# Patient Record
Sex: Female | Born: 1965
Health system: Southern US, Community
[De-identification: ages and names within clinical notes are randomized; demographics above are authoritative.]

## PROBLEM LIST (undated history)

## (undated) DIAGNOSIS — K449 Diaphragmatic hernia without obstruction or gangrene: Secondary | ICD-10-CM

## (undated) DIAGNOSIS — F1290 Cannabis use, unspecified, uncomplicated: Secondary | ICD-10-CM

## (undated) HISTORY — DX: Diaphragmatic hernia without obstruction or gangrene: K44.9

## (undated) HISTORY — PX: UPPER GI ENDOSCOPY: SHX6162

## (undated) HISTORY — PX: TUBAL LIGATION: SHX77

## (undated) HISTORY — PX: TONSILLECTOMY: SUR1361

## (undated) HISTORY — PX: GANGLION CYST EXCISION: SHX1691

## (undated) HISTORY — PX: ABDOMINAL HYSTERECTOMY: SHX81

## (undated) HISTORY — DX: Cannabis use, unspecified, uncomplicated: F12.90

## (undated) HISTORY — PX: ANKLE SURGERY: SHX546

---

## 1999-05-30 ENCOUNTER — Other Ambulatory Visit: Admission: RE | Admit: 1999-05-30 | Discharge: 1999-05-30 | Payer: Self-pay | Admitting: Obstetrics and Gynecology

## 2005-03-08 ENCOUNTER — Observation Stay: Payer: Self-pay | Admitting: Obstetrics & Gynecology

## 2005-04-07 ENCOUNTER — Inpatient Hospital Stay: Payer: Self-pay | Admitting: Obstetrics & Gynecology

## 2005-04-19 ENCOUNTER — Emergency Department: Payer: Self-pay | Admitting: Emergency Medicine

## 2007-02-26 ENCOUNTER — Ambulatory Visit: Payer: Self-pay | Admitting: Obstetrics & Gynecology

## 2007-02-28 ENCOUNTER — Ambulatory Visit: Payer: Self-pay | Admitting: Internal Medicine

## 2007-03-04 ENCOUNTER — Other Ambulatory Visit: Payer: Self-pay

## 2007-03-04 ENCOUNTER — Ambulatory Visit: Payer: Self-pay | Admitting: Internal Medicine

## 2007-12-15 ENCOUNTER — Ambulatory Visit: Payer: Self-pay | Admitting: Internal Medicine

## 2008-03-16 ENCOUNTER — Ambulatory Visit: Payer: Self-pay | Admitting: Obstetrics & Gynecology

## 2008-04-19 ENCOUNTER — Ambulatory Visit: Payer: Self-pay | Admitting: Internal Medicine

## 2008-05-06 ENCOUNTER — Encounter: Payer: Self-pay | Admitting: Internal Medicine

## 2008-06-01 ENCOUNTER — Encounter: Payer: Self-pay | Admitting: Internal Medicine

## 2008-07-02 HISTORY — PX: LAPAROSCOPIC SUPRACERVICAL HYSTERECTOMY: SUR797

## 2009-04-05 ENCOUNTER — Ambulatory Visit: Payer: Self-pay | Admitting: Obstetrics & Gynecology

## 2009-04-12 ENCOUNTER — Ambulatory Visit: Payer: Self-pay | Admitting: Obstetrics & Gynecology

## 2009-04-27 ENCOUNTER — Ambulatory Visit: Payer: Self-pay | Admitting: Obstetrics & Gynecology

## 2009-05-05 ENCOUNTER — Ambulatory Visit: Payer: Self-pay | Admitting: Obstetrics & Gynecology

## 2010-04-25 LAB — LIPID PANEL
Cholesterol: 181 mg/dL (ref 0–200)
HDL: 60 mg/dL (ref 35–70)
LDl/HDL Ratio: 1.8
Triglycerides: 59 mg/dL (ref 40–160)

## 2010-04-25 LAB — BASIC METABOLIC PANEL
Glucose: 87 mg/dL
Potassium: 4.6 mmol/L (ref 3.4–5.3)
Sodium: 137 mmol/L (ref 137–147)

## 2010-04-25 LAB — TSH: TSH: 2.26 u[IU]/mL (ref 0.41–5.90)

## 2010-04-25 LAB — CBC AND DIFFERENTIAL
HCT: 39 % (ref 36–46)
Hemoglobin: 13.6 g/dL (ref 12.0–16.0)
Neutrophils Absolute: 59 /uL

## 2010-05-09 ENCOUNTER — Ambulatory Visit: Payer: Self-pay | Admitting: Obstetrics & Gynecology

## 2010-05-31 ENCOUNTER — Ambulatory Visit: Payer: Self-pay | Admitting: Obstetrics & Gynecology

## 2011-01-04 ENCOUNTER — Ambulatory Visit: Payer: Self-pay | Admitting: Obstetrics & Gynecology

## 2011-02-21 ENCOUNTER — Ambulatory Visit (INDEPENDENT_AMBULATORY_CARE_PROVIDER_SITE_OTHER): Payer: Self-pay | Admitting: Internal Medicine

## 2011-02-21 ENCOUNTER — Encounter: Payer: Self-pay | Admitting: Internal Medicine

## 2011-02-21 DIAGNOSIS — K2970 Gastritis, unspecified, without bleeding: Secondary | ICD-10-CM

## 2011-02-21 DIAGNOSIS — K2990 Gastroduodenitis, unspecified, without bleeding: Secondary | ICD-10-CM

## 2011-02-21 MED ORDER — TRAMADOL HCL 50 MG PO TABS: 50.0000 mg | ORAL_TABLET | Freq: Four times a day (QID) | ORAL | Status: AC | PRN

## 2011-02-21 MED ORDER — ESOMEPRAZOLE MAGNESIUM 40 MG PO CPDR
40.0000 mg | DELAYED_RELEASE_CAPSULE | Freq: Every day | ORAL | Status: DC
Start: 2011-02-21 — End: 2011-04-10

## 2011-02-21 NOTE — Progress Notes (Signed)
  Subjective:    Patient ID: Kelly Yang, female    DOB: 08/26/1965, 45 y.o.   MRN: [id number]  HPI Patient presents with 1 months history of worsening abdominal pain which is epsiodic in nature and at times limiting her po intake. Aggravated by bowel movements and hunger. Improves when she smokes marijuana Mid epigastric, not crampy in quality, more burning or aching in nature.  Used to take nexium for GERD years ago but no PPI in the last year.  Has been using ibuprofen daily,  Alcohol on the weekends in moderation.  No weight loss, vomiting or nausea.  No diarrhea or blood in stools. Pain    Review of Systems  Constitutional: Negative for fever, chills and unexpected weight change.  HENT: Negative for hearing loss, ear pain, nosebleeds, congestion, sore throat, facial swelling, rhinorrhea, sneezing, mouth sores, trouble swallowing, neck pain, neck stiffness, voice change, postnasal drip, sinus pressure, tinnitus and ear discharge.   Eyes: Negative for pain, discharge, redness and visual disturbance.  Respiratory: Negative for cough, chest tightness, shortness of breath, wheezing and stridor.   Cardiovascular: Negative for chest pain, palpitations and leg swelling.  Gastrointestinal: Positive for abdominal pain. Negative for nausea, diarrhea, constipation and blood in stool.  Musculoskeletal: Positive for arthralgias. Negative for myalgias.  Skin: Negative for color change and rash.  Neurological: Negative for dizziness, weakness, light-headedness and headaches.  Hematological: Negative for adenopathy.  Psychiatric/Behavioral: The patient is nervous/anxious.        Objective:   Physical Exam  Constitutional: Vital signs are normal. She appears well-developed and well-nourished.  Cardiovascular: Normal rate and regular rhythm.   Abdominal: Soft. Bowel sounds are normal. She exhibits no shifting dullness, no distension, no pulsatile liver, no abdominal bruit and no pulsatile midline mass.  There is tenderness.    Musculoskeletal: Normal range of motion.          Assessment & Plan:  1) Abdominal pain: given her history, I suspect NSAID induced gastritis.  Trial of Nexium and stop all NSAIDs,  Will check H Pylori Ag.  If symptoms do no improve in 2-3 weeks, refer to GI for evaluation.  2) Depression: symptoms aggravated by noncompliance with daily use of SSRI.  Discussed pharacokinetics of SSRIss; she will try to remember to take meds on weekends.

## 2011-02-22 LAB — COMPREHENSIVE METABOLIC PANEL
ALT: 13 U/L (ref 0–35)
BUN: 15 mg/dL (ref 6–23)
CO2: 30 mEq/L (ref 19–32)
Calcium: 9.5 mg/dL (ref 8.4–10.5)
Chloride: 102 mEq/L (ref 96–112)
Creatinine, Ser: 0.9 mg/dL (ref 0.4–1.2)
GFR: 69.61 mL/min (ref >60.00)
Glucose, Bld: 96 mg/dL (ref 70–99)
Total Bilirubin: 0.6 mg/dL (ref 0.3–1.2)

## 2011-02-22 LAB — LIPASE: Lipase: 52 U/L (ref 11.0–59.0)

## 2011-02-22 LAB — H. PYLORI ANTIBODY, IGG: H Pylori IgG: NEGATIVE

## 2011-02-23 ENCOUNTER — Encounter: Payer: Self-pay | Admitting: Internal Medicine

## 2011-02-28 ENCOUNTER — Telehealth: Payer: Self-pay | Admitting: Internal Medicine

## 2011-02-28 MED ORDER — PROMETHAZINE HCL 12.5 MG PO TABS: 25.0000 mg | ORAL_TABLET | Freq: Four times a day (QID) | ORAL | Status: AC | PRN

## 2011-02-28 MED ORDER — SUMATRIPTAN 20 MG/ACT NA SOLN: 1.0000 | NASAL | Status: DC | PRN

## 2011-02-28 NOTE — Telephone Encounter (Signed)
Patient called and stated she has had a migraine for the past three days with nausea and  vomiting.  She wanted to know if there is anything you could suggest for her to do or take.  Please advise

## 2011-02-28 NOTE — Telephone Encounter (Signed)
Can you call her in an rx for promethazine 25 mg one tablet every 8 hours as needed for nausea and vomiting ?  I will print the rx for this and for a migrain medication

## 2011-02-28 NOTE — Telephone Encounter (Signed)
Kelly Yang,  i sent rxs for promethazine and imitrex nasal spray to her pharmacy.

## 2011-03-12 ENCOUNTER — Telehealth: Payer: Self-pay | Admitting: Internal Medicine

## 2011-03-12 DIAGNOSIS — K2970 Gastritis, unspecified, without bleeding: Secondary | ICD-10-CM

## 2011-03-12 NOTE — Telephone Encounter (Signed)
Patient called and stated she saw you a couple weeks ago for stomach issues and you gave her Nexium and she has been taking it for three weeks.  She stated she is not felling better and you had said if the Nexium did not work you would refer her to GI.  Do you still want to refer patient?  Please advise.

## 2011-03-12 NOTE — Telephone Encounter (Signed)
Yes, does she want to someone in [place] or [place]?  If [place],  [facility].  IF [place].   GI.

## 2011-03-15 NOTE — Telephone Encounter (Signed)
Please make patient appt with [facility] for evaluation of gastritis not responding to daily Nexium.  Thank you

## 2011-03-15 NOTE — Telephone Encounter (Signed)
Patient says that she would like to go to [facility] in [place].

## 2011-04-10 ENCOUNTER — Encounter: Payer: Self-pay | Admitting: Gastroenterology

## 2011-04-10 ENCOUNTER — Ambulatory Visit (INDEPENDENT_AMBULATORY_CARE_PROVIDER_SITE_OTHER): Payer: BC Managed Care – PPO | Admitting: Gastroenterology

## 2011-04-10 ENCOUNTER — Other Ambulatory Visit: Payer: BC Managed Care – PPO

## 2011-04-10 DIAGNOSIS — R140 Abdominal distension (gaseous): Secondary | ICD-10-CM

## 2011-04-10 DIAGNOSIS — R143 Flatulence: Secondary | ICD-10-CM

## 2011-04-10 DIAGNOSIS — R141 Gas pain: Secondary | ICD-10-CM

## 2011-04-10 DIAGNOSIS — R109 Unspecified abdominal pain: Secondary | ICD-10-CM

## 2011-04-10 DIAGNOSIS — R6881 Early satiety: Secondary | ICD-10-CM | POA: Insufficient documentation

## 2011-04-10 MED ORDER — HYOSCYAMINE SULFATE 0.125 MG SL SUBL: 0.1250 mg | SUBLINGUAL_TABLET | SUBLINGUAL | Status: DC | PRN

## 2011-04-10 NOTE — Progress Notes (Signed)
History of Present Illness:  This is a very nice 45 year old mother of 3 children referred for evaluation of several years of recurrent epigastric and right upper quadrant pain with apparently a negative gallbladder evaluation 3 years ago. For the last 3 months she's had almost daily epigastric and right upper quadrant discomfort exacerbated by eating with associated early satiety, belching, burping, bloating and distention. She has some reflux symptoms, but a one-month followup Nexium caused no improvement. Patient denies nausea vomiting, anorexia or weight loss. There is no history of NSAID, cigarette, or alcohol abuse. The patient denies melena, hematochezia, or systemic complaints or specific hepatobiliary complaints. She her family is of [place] descent. She has not had previous endoscopic exams.  I have reviewed this patient's present history, medical and surgical past history, allergies and medications.     ROS: The remainder of the 10 point ROS is negative.. she's had a previous partial hysterectomy and denies any gynecologic issues. He does have occasional headaches and occasional shortness of breath with exercise. She has been on Celexa for depression all when necessary Imitrex, when necessary tramadol, and daily Valtrex.     Physical Exam: General well developed well nourished patient in no acute distress, appearing her stated age Eyes PERRLA, no icterus, fundoscopic exam per opthamologist Skin no lesions noted Neck supple, no adenopathy, no thyroid enlargement, no tenderness Chest clear to percussion and auscultation Heart no significant murmurs, gallops or rubs noted Abdomen no hepatosplenomegaly masses or tenderness, BS normal.  Rectal inspection normal no fissures, or fistulae noted.  No masses or tenderness on digital exam. Stool guaiac negative. Extremities no acute joint lesions, edema, phlebitis or evidence of cellulitis. Neurologic patient oriented x 3, cranial nerves intact,  no focal neurologic deficits noted. Psychological mental status normal and normal affect.  Assessment and plan: Possible gastroparesis, rule out celiac disease, gallbladder disease versus IBS. H. pylori serum antibodies were negative. There is no history of hepatitis or pancreatitis. Physical exam today is fairly unremarkable. I have scheduled upper abdominal ultrasound exam, endoscopic exam, and we'll check his serum celiac antibodies area in at prescribed when necessary sublingual Levsin pending further evaluation. She may need CCK-Hida scanning of her gallbladder.   Please copy her primary care physician, referring physician, and pertinent subspecialists.  Encounter Diagnoses  Name Primary?  . Abdominal pain Yes  . Abdominal bloating   . Early satiety

## 2011-04-10 NOTE — Patient Instructions (Addendum)
You have been scheduled for an Endoscopy, instructions have been provided. You have been scheduled for an Ultrasound at [facility] radiology please arrive at 745 am on 04/16/11 Nothing to eat or drink after midnight Please go to our basement lab today for lab work. Your prescription for Levsin has been sent to your pharmacy.

## 2011-04-11 LAB — CELIAC PANEL 10
Endomysial Screen: NEGATIVE
Gliadin IgG: 21.7 U/mL — ABNORMAL HIGH (ref <20)
Tissue Transglutaminase Ab, IgA: 5.6 U/mL (ref <20)

## 2011-04-13 ENCOUNTER — Encounter: Payer: Self-pay | Admitting: Gastroenterology

## 2011-04-13 ENCOUNTER — Telehealth: Payer: Self-pay | Admitting: *Deleted

## 2011-04-13 ENCOUNTER — Ambulatory Visit (AMBULATORY_SURGERY_CENTER): Payer: BC Managed Care – PPO | Admitting: Gastroenterology

## 2011-04-13 VITALS — BP 121/69 | HR 68 | Temp 98.0°F | Resp 20 | Ht 63.0 in | Wt 142.0 lb

## 2011-04-13 DIAGNOSIS — R109 Unspecified abdominal pain: Secondary | ICD-10-CM

## 2011-04-13 MED ORDER — LIDOCAINE VISCOUS 2 % MT SOLN: 20.0000 mL | OROMUCOSAL | Status: DC | PRN

## 2011-04-13 MED ORDER — SODIUM CHLORIDE 0.9 % IV SOLN: 500.0000 mL | INTRAVENOUS | Status: DC

## 2011-04-13 MED ORDER — LIDOCAINE VISCOUS 2 % MT SOLN: OROMUCOSAL | Status: DC

## 2011-04-13 NOTE — Patient Instructions (Addendum)
Incomplete exam-will add barium swallow / UGI  Series to ultrasound Monday . To arrive at 0745 for 8 am exam.  Please follow dilation diet as directed Will add viscous lidocaine to medication therapy- take 20 ml every 8 hours as needed for throat pain/ difficulty swallowing.

## 2011-04-13 NOTE — Telephone Encounter (Signed)
Ordered BS and UGI for Monday, 04/16/11 after pt's U/S. Also ordered Viscous Lidocaine for throat pain. Husband given instructions and appt times for procedures at [facility]; NPO after midnight and arrive at 07:45am; husband stated understanding.

## 2011-04-16 ENCOUNTER — Ambulatory Visit (HOSPITAL_COMMUNITY)
Admission: RE | Admit: 2011-04-16 | Discharge: 2011-04-16 | Disposition: A | Discharge status: Home / Self Care | Payer: BC Managed Care – PPO | Source: Ambulatory Visit | Attending: Gastroenterology | Admitting: Gastroenterology

## 2011-04-16 ENCOUNTER — Telehealth: Payer: Self-pay | Admitting: *Deleted

## 2011-04-16 DIAGNOSIS — R109 Unspecified abdominal pain: Secondary | ICD-10-CM | POA: Insufficient documentation

## 2011-04-16 NOTE — Telephone Encounter (Signed)

## 2011-04-16 NOTE — Progress Notes (Signed)
It was completed at the same time as the Upper GI series and results should come to you shortly.

## 2011-04-16 NOTE — Telephone Encounter (Signed)
Lm fpr pt to return call. Per Dr Jarold Motto, tests- U/S and barium swallow are normal; maybe her problem is coming from spasms. Per Dr Jarold Motto, schedule for f/u in  2 weeks: 05/03/11 at 0845am.

## 2011-04-17 NOTE — Telephone Encounter (Signed)
Informed pt of her f/u appt with Dr Jarold Motto to reassess her problems since U/S and BS were normal; pt will call for further problems.

## 2011-04-17 NOTE — Telephone Encounter (Signed)
Called pt at work and [facility] for her to call back.

## 2011-05-03 ENCOUNTER — Ambulatory Visit (INDEPENDENT_AMBULATORY_CARE_PROVIDER_SITE_OTHER): Payer: BC Managed Care – PPO | Admitting: Gastroenterology

## 2011-05-03 ENCOUNTER — Encounter: Payer: Self-pay | Admitting: Gastroenterology

## 2011-05-03 DIAGNOSIS — K3189 Other diseases of stomach and duodenum: Secondary | ICD-10-CM

## 2011-05-03 DIAGNOSIS — K30 Functional dyspepsia: Secondary | ICD-10-CM | POA: Insufficient documentation

## 2011-05-03 DIAGNOSIS — R109 Unspecified abdominal pain: Secondary | ICD-10-CM

## 2011-05-03 DIAGNOSIS — R1013 Epigastric pain: Secondary | ICD-10-CM

## 2011-05-03 NOTE — Patient Instructions (Addendum)
Dr Jarold Motto has recommended you to have a colonoscopy, If you decide to schedule you call call us back at [phone] We have also recommended you have a HIDA Scan with CCK to check your gallbladder. If you decide to schedule please contact our office

## 2011-05-03 NOTE — Progress Notes (Signed)
This is a 45 year old Caucasian female with persistent and recurrent epigastric and right upper quadrant  pain which occurs almost daily and is described as an aching discomfort made worse by bowel movements. The pain is not severe but may last 3-4 hours in duration. No other precipitating or alleviating elements except for some early satiety.. She tried one month of Nexium without improvement. She has refused to try anticholinergic medications. . Attempts at endoscopy were unsuccessful because of severe cricopharyngeal spasm, and post endoscopy upper GI series showed cricopharyngeal muscle thickening, but no other abnormalities. H. pylori antibodies are negative, and all lab tests are normal.  Upper bowel ultrasound also was performed and was unremarkable. She apparently had a previous negative gallbladder workup 3 years ago and was told that she had a" sluggish gallbladder". Apparently both her mother and her sister have had cholecystectomies. The patient denies other hepatobiliary complaints. She denies any specific food intolerances, and celiac bodies were negative. There is no history of anorexia, weight loss, or systemic complaints such as fever chills. She was previously on NSAIDs but has discontinued these medications.  Current Medications, Allergies, Past Medical History, Past Surgical History, Family History and Social History were reviewed in [employer] record.  Pertinent Review of Systems Negative   Physical Exam: Healthy-appearing white female in no acute distress appears stated age. Abdominal exam shows no organomegaly, masses, or localized tenderness. Bowel sounds are entirely normal.    Assessment and Plan: Probable functional dyspepsia, rule out biliary dyskinesia . I am concerned about her pain related to bowel movements, and I think colonoscopy is probably indicated as part of her workup since she has continued to have symptoms for many months unresponsive to  standard treatment. I have urged her to use when necessary sublingual Levsin. The patient refused both of these recommendations, and related that she will seek followup care with her primary care physician. Encounter Diagnosis  Name Primary?  . Abdominal pain Yes

## 2011-08-14 ENCOUNTER — Other Ambulatory Visit: Payer: Self-pay | Admitting: *Deleted

## 2011-08-14 MED ORDER — SUMATRIPTAN 20 MG/ACT NA SOLN: 1.0000 | NASAL | Status: DC | PRN

## 2011-08-14 MED ORDER — SUMATRIPTAN 5 MG/ACT NA SOLN: 1.0000 | NASAL | Status: DC | PRN

## 2011-08-14 NOTE — Telephone Encounter (Signed)
20 mg rx sent to [facility]

## 2011-08-14 NOTE — Telephone Encounter (Signed)
Pt states she was given sumatriptan nasal spray in the past for migraines and she would like refills called to [facility] [address]  She says this really helps her headaches.

## 2011-08-14 NOTE — Telephone Encounter (Signed)
She had 20 mg's before, per refill 02/28/11.

## 2011-08-14 NOTE — Telephone Encounter (Signed)
There are two strengths to the nasal spray,  5, and 20.  If she does not known which one she was taken , start with the 5. I will update chart with rx for 5 mg dose.

## 2011-10-04 ENCOUNTER — Other Ambulatory Visit: Payer: Self-pay | Admitting: Internal Medicine

## 2011-10-05 MED ORDER — CITALOPRAM HYDROBROMIDE 10 MG PO TABS: 10.0000 mg | ORAL_TABLET | Freq: Every day | ORAL | Status: DC

## 2011-10-19 ENCOUNTER — Other Ambulatory Visit: Payer: Self-pay | Admitting: Internal Medicine

## 2011-10-19 MED ORDER — CITALOPRAM HYDROBROMIDE 20 MG PO TABS: 20.0000 mg | ORAL_TABLET | Freq: Every day | ORAL | Status: DC

## 2012-05-04 ENCOUNTER — Other Ambulatory Visit: Payer: Self-pay | Admitting: Internal Medicine

## 2012-07-07 ENCOUNTER — Encounter: Payer: Self-pay | Admitting: Internal Medicine

## 2012-07-07 ENCOUNTER — Ambulatory Visit (INDEPENDENT_AMBULATORY_CARE_PROVIDER_SITE_OTHER): Payer: BC Managed Care – PPO | Admitting: Internal Medicine

## 2012-07-07 VITALS — BP 118/72 | HR 69 | Temp 98.0°F | Resp 16 | Wt 149.5 lb

## 2012-07-07 DIAGNOSIS — J40 Bronchitis, not specified as acute or chronic: Secondary | ICD-10-CM | POA: Insufficient documentation

## 2012-07-07 DIAGNOSIS — E785 Hyperlipidemia, unspecified: Secondary | ICD-10-CM

## 2012-07-07 DIAGNOSIS — F331 Major depressive disorder, recurrent, moderate: Secondary | ICD-10-CM

## 2012-07-07 DIAGNOSIS — R5383 Other fatigue: Secondary | ICD-10-CM

## 2012-07-07 DIAGNOSIS — R5381 Other malaise: Secondary | ICD-10-CM

## 2012-07-07 MED ORDER — AZITHROMYCIN 500 MG PO TABS: 500.0000 mg | ORAL_TABLET | Freq: Every day | ORAL | Status: DC

## 2012-07-07 MED ORDER — PREDNISONE (PAK) 10 MG PO TABS: ORAL_TABLET | ORAL | Status: DC

## 2012-07-07 MED ORDER — BUPROPION HCL 75 MG PO TABS: 75.0000 mg | ORAL_TABLET | Freq: Two times a day (BID) | ORAL | Status: DC

## 2012-07-07 NOTE — Assessment & Plan Note (Signed)
Symptoms are currently not well-controlled with citalopram and may have been triggered by the holidays. We discussed a trial of Wellbutrin as this seems to be more energizing. She is a remote trial of Wellbutrin with the only side effect she experienced the loss of libido. She is willing to try a medication change at this time due to her current symptoms.

## 2012-07-07 NOTE — Assessment & Plan Note (Signed)
HEENT exam is normal. Reassurance provided this is most likely viral bronchitis. Over-the-counter medications recommended and a prescription for azithromycin and given instructions to start only if he developed temperature greater than 100.4, purulent sputum, or increased work of breathing. Prednisone taper and cough suppressant given now to help with cycle.

## 2012-07-07 NOTE — Patient Instructions (Addendum)
I am prescribing you a prednisone taper, a 7 day course of vicodin for the cough  You should use either sudafed PE/afrin for nasal congestion or Afrin nasal spray twice daily for 5 days for the ear and sinus congestion   I also recommending using Simply Saline nasal spray twice daily to flush your sinuses out. (available OTC)  If your cough worsens add the azithromcyin   -----------------------------------------------------------------  Start the wellbutrin twice daily dose (2nd dose by 2 pm to prevent sleep issues)  Taper the citalopram to every other day after Day 3 for 7 days,  Then stop   We can increase your wellbutrin by e mail or follow up   Go for a walk!!

## 2012-07-07 NOTE — Progress Notes (Signed)
Patient ID: Kelly Yang, female   DOB: 12/05/1965, 47 y.o.   MRN: [id number]  Patient Active Problem List  Diagnosis  . Abdominal pain  . Abdominal bloating  . Early satiety  . Functional dyspepsia  . Depression, major, recurrent, moderate  . Bronchitis    Subjective:  CC:   Chief Complaint  Patient presents with  . Cough    x3weeks, productive  . Sore Throat    HPI:   Kelly Yang a 47 y.o. female who presents 1) 3 week history of persistent cough, which has been accompanied by sputum and nasal  drainage which was initially yellow.  2) depression,   managed historically with Citalopram 20 milligrams daily which has is no longer effective. Spent 2 weeks at home over the holidays,  spent half of the time sleeping, and at the end of her holiday she was too depressed to go to work. Did not want to be around people .  Feels like a failure as a mother,  Not angry.  Stuck in the same routine .   not suicidal. No history of physical or sexual abuse.    Past Medical History  Diagnosis Date  . Allergic rhinitis     rhinitis  . Hiatal hernia     EGD @ 18  . Genital herpes   . Marijuana smoker     Past Surgical History  Procedure Date  . Tubal ligation   . Abdominal hysterectomy     partical  . Tonsillectomy   . Ankle surgery     bilateral spurs  . Ganglion cyst excision     right wrist   The following portions of the patient's history were reviewed and updated as appropriate: Allergies, current medications, and problem list.    Review of Systems:   12 Pt  review of systems was negative except those addressed in the HPI,     History   Social History  . Marital Status: Married    Spouse Name: N/A    Number of Children: 3  . Years of Education: N/A   Occupational History  . chemist II    Social History Main Topics  . Smoking status: Never Smoker   . Smokeless tobacco: Never Used     Comment: Uses marijuana,daily use for over 15 years , still uses  regularly but not daily  . Alcohol Use: Yes     Comment: occasional  . Drug Use: Yes     Comment: marijuana  . Sexually Active: Not on file     Comment: remotely quit drug use(heavy marijuana for 15 years,quit when she was pregnant) and snorted cocaine < 10 times entire life in the past   Other Topics Concern  . Not on file   Social History Narrative   Lives with spouse and 2 childernHas dog    Objective:  BP 118/72  Pulse 69  Temp 98 F (36.7 C) (Oral)  Resp 16  Wt 149 lb 8 oz (67.813 kg)  SpO2 98%  General appearance: alert, cooperative and appears stated age Ears: normal TM's and external ear canals both ears Throat: lips, mucosa, and tongue normal; teeth and gums normal Neck: no adenopathy, no carotid bruit, supple, symmetrical, trachea midline and thyroid not enlarged, symmetric, no tenderness/mass/nodules Back: symmetric, no curvature. ROM normal. No CVA tenderness. Lungs: clear to auscultation bilaterally Heart: regular rate and rhythm, S1, S2 normal, no murmur, click, rub or gallop Abdomen: soft, non-tender; bowel sounds normal; no masses,  no organomegaly Pulses: 2+ and symmetric Skin: Skin color, texture, turgor normal. No rashes or lesions Lymph nodes: Cervical, supraclavicular, and axillary nodes normal.  Assessment and Plan:  Depression, major, recurrent, moderate Symptoms are currently not well-controlled with citalopram and may have been triggered by the holidays. We discussed a trial of Wellbutrin as this seems to be more energizing. She is a remote trial of Wellbutrin with the only side effect she experienced the loss of libido. She is willing to try a medication change at this time due to her current symptoms.  Bronchitis HEENT exam is normal. Reassurance provided this is most likely viral bronchitis. Over-the-counter medications recommended and a prescription for azithromycin and given instructions to start only if he developed temperature greater than  100.4, purulent sputum, or increased work of breathing. Prednisone taper and cough suppressant given now to help with cycle.   Updated Medication List Outpatient Encounter Prescriptions as of 07/07/2012  Medication Sig Dispense Refill  . citalopram (CELEXA) 20 MG tablet TAKE 1 TABLET DAILY  90 tablet  0  . SUMAtriptan (IMITREX) 20 MG/ACT nasal spray Place 1 spray (20 mg total) into the nose every 2 (two) hours as needed for migraine.  1 Inhaler  5  . valACYclovir (VALTREX) 500 MG tablet Take 500 mg by mouth as needed.        Marland Kitchen azithromycin (ZITHROMAX) 500 MG tablet Take 1 tablet (500 mg total) by mouth daily.  7 tablet  0  . buPROPion (WELLBUTRIN) 75 MG tablet Take 1 tablet (75 mg total) by mouth 2 (two) times daily.  60 tablet  1  . hyoscyamine (LEVSIN SL) 0.125 MG SL tablet Place 0.125 mg under the tongue every 6 (six) hours as needed.        . hyoscyamine (LEVSIN/SL) 0.125 MG SL tablet Place 1 tablet (0.125 mg total) under the tongue as needed for cramping.  30 tablet  0  . NEXIUM 40 MG capsule As needed      . predniSONE (STERAPRED UNI-PAK) 10 MG tablet 6 tablets on Day 1 , then reduce by 1 tablet daily until gone  21 tablet  0     Orders Placed This Encounter  Procedures  . Lipid panel  . Comprehensive metabolic panel  . TSH  . Iron and TIBC  . Follicle stimulating hormone  . B12  . CBC with Differential    No Follow-up on file.

## 2012-08-05 ENCOUNTER — Ambulatory Visit (INDEPENDENT_AMBULATORY_CARE_PROVIDER_SITE_OTHER): Payer: BC Managed Care – PPO | Admitting: Internal Medicine

## 2012-08-05 ENCOUNTER — Encounter: Payer: Self-pay | Admitting: Internal Medicine

## 2012-08-05 VITALS — BP 116/70 | HR 77 | Temp 98.1°F | Ht 63.5 in | Wt 150.2 lb

## 2012-08-05 DIAGNOSIS — F331 Major depressive disorder, recurrent, moderate: Secondary | ICD-10-CM

## 2012-08-05 MED ORDER — SERTRALINE HCL 50 MG PO TABS: 50.0000 mg | ORAL_TABLET | Freq: Every day | ORAL | Status: DC

## 2012-08-05 NOTE — Progress Notes (Signed)
Patient ID: Kelly Yang, female   DOB: May 16, 1966, 47 y.o.   MRN: [id number]  Patient Active Problem List  Diagnosis  . Abdominal pain  . Abdominal bloating  . Early satiety  . Functional dyspepsia  . Depression, major, recurrent, moderate  . Bronchitis    Subjective:  CC:   Chief Complaint  Patient presents with  . Follow-up    HPI:   Kelly R Claytonis a 47 y.o. female who presents for follow up on medication changes for increasing dysphoria and irritability of which start in the holidays. One month ago we switched her citalopram to Wellbutrin due to her symptoms of lack of concentration lack of energy. Since she has been taking the Wellbutrin she has noticed increased episodes of dizziness with sudden changes in head position which have been recurrent. She's also noting increased anger and irritability and has been outwardly aggressive and hostile.  He did not have these symptoms when she was taking citalopram however on citalopram she was more anxious and sad.     Prior trial of effexor caused chest pain .    Not functioning well at work,  No trouble sleeping     Past Medical History  Diagnosis Date  . Allergic rhinitis     rhinitis  . Hiatal hernia     EGD @ 18  . Genital herpes   . Marijuana smoker     Past Surgical History  Procedure Date  . Tubal ligation   . Abdominal hysterectomy     partical  . Tonsillectomy   . Ankle surgery     bilateral spurs  . Ganglion cyst excision     right wrist   The following portions of the patient's history were reviewed and updated as appropriate: Allergies, current medications, and problem list.    Review of Systems:  Patient denies headache, fevers, malaise, unintentional weight loss, skin rash, eye pain, sinus congestion and sinus pain, sore throat, dysphagia,  hemoptysis , cough, dyspnea, wheezing, chest pain, palpitations, orthopnea, edema, abdominal pain, nausea, melena, diarrhea, constipation, flank pain, dysuria,  hematuria, urinary  Frequency, nocturia, numbness, tingling, seizures,  Focal weakness, Loss of consciousness,  Tremor, insomnia, depression, anxiety, and suicidal ideation.     History   Social History  . Marital Status: Married    Spouse Name: N/A    Number of Children: 3  . Years of Education: N/A   Occupational History  . chemist II    Social History Main Topics  . Smoking status: Never Smoker   . Smokeless tobacco: Never Used     Comment: Uses marijuana,daily use for over 15 years , still uses regularly but not daily  . Alcohol Use: Yes     Comment: occasional  . Drug Use: Yes     Comment: marijuana  . Sexually Active: Not on file     Comment: remotely quit drug use(heavy marijuana for 15 years,quit when she was pregnant) and snorted cocaine < 10 times entire life in the past   Other Topics Concern  . Not on file   Social History Narrative   Lives with spouse and 2 childernHas dog    Objective:  BP 116/70  Pulse 77  Temp 98.1 F (36.7 C) (Oral)  Ht 5' 3.5" (1.613 m)  Wt 150 lb 4 oz (68.153 kg)  BMI 26.20 kg/m2  SpO2 97%  General appearance: alert, cooperative and appears stated age Ears: normal TM's and external ear canals both ears Throat: lips, mucosa, and  tongue normal; teeth and gums normal Neck: no adenopathy, no carotid bruit, supple, symmetrical, trachea midline and thyroid not enlarged, symmetric, no tenderness/mass/nodules Back: symmetric, no curvature. ROM normal. No CVA tenderness. Lungs: clear to auscultation bilaterally Heart: regular rate and rhythm, S1, S2 normal, no murmur, click, rub or gallop Abdomen: soft, non-tender; bowel sounds normal; no masses,  no organomegaly Pulses: 2+ and symmetric Skin: Skin color, texture, turgor normal. No rashes or lesions Lymph nodes: Cervical, supraclavicular, and axillary nodes normal. Psych: Makes good eye contact. Thought processes normal. She can articulate her feelings without trouble. She is reacting  normally with 2 children who are present.  Assessment and Plan:  Depression, major, recurrent, moderate She is not tolerating the change in therapy from citalopram to Wellbutrin due to recurrent dizziness and increased hostility. 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 She has no trouble with insomnia. She is not suicidal. we will try Zoloft. Cross taper recommended. Patient will followup with me by e-mail in a few weeks to me know how this is working. Stops stops  25 minutes was spent in face-to-face time the patient over 50% of which was spent in counseling. Updated Medication List Outpatient Encounter Prescriptions as of 08/05/2012  Medication Sig Dispense Refill  . azithromycin (ZITHROMAX) 500 MG tablet Take 1 tablet (500 mg total) by mouth daily.  7 tablet  0  . buPROPion (WELLBUTRIN) 75 MG tablet Take 1 tablet (75 mg total) by mouth 2 (two) times daily.  60 tablet  1  . citalopram (CELEXA) 20 MG tablet TAKE 1 TABLET DAILY  90 tablet  0  . hyoscyamine (LEVSIN SL) 0.125 MG SL tablet Place 0.125 mg under the tongue every 6 (six) hours as needed.        Marland Kitchen NEXIUM 40 MG capsule As needed      . predniSONE (STERAPRED UNI-PAK) 10 MG tablet 6 tablets on Day 1 , then reduce by 1 tablet daily until gone  21 tablet  0  . SUMAtriptan (IMITREX) 20 MG/ACT nasal spray Place 1 spray (20 mg total) into the nose every 2 (two) hours as needed for migraine.  1 Inhaler  5  . valACYclovir (VALTREX) 500 MG tablet Take 500 mg by mouth as needed.        . hyoscyamine (LEVSIN/SL) 0.125 MG SL tablet Place 1 tablet (0.125 mg total) under the tongue as needed for cramping.  30 tablet  0  . sertraline (ZOLOFT) 50 MG tablet Take 1 tablet (50 mg total) by mouth daily.  30 tablet  3     No orders of the defined types were placed in this encounter.    No Follow-up on file.

## 2012-08-05 NOTE — Patient Instructions (Addendum)
Start with 1/2 zoloft tablet daily for 4 days ,  continue wellbutrin twice daily  for 4 days.  Then increase zoloft to full tablet and decrease wellbutrin to one tablet for 3 days,  By end of week 1 you will just be taking 50 mg zoloft daily  After two weeks of zoloft 50 mg daily ,  We can increase to 100 mg if needed.   Use MyChart  In about 3 weeks to follow up on symptoms

## 2012-08-06 NOTE — Assessment & Plan Note (Signed)
She is not tolerating the change in therapy from citalopram to Wellbutrin due to recurrent dizziness and increased hostility. 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 She has no trouble with insomnia. She is not suicidal. we will try Zoloft. Cross taper recommended. Patient will followup with me by e-mail in a few weeks to me know how this is working. Stops stops

## 2013-01-12 ENCOUNTER — Telehealth: Payer: Self-pay | Admitting: *Deleted

## 2013-01-12 MED ORDER — CITALOPRAM HYDROBROMIDE 20 MG PO TABS: ORAL_TABLET | ORAL | Status: DC

## 2013-01-12 NOTE — Telephone Encounter (Signed)
Ok to resume citalopram.  rx sent to local pharmacy,  Start with 1/2 tablet daily for one week and reduce wellbutrin to once daily for one week then stop the wellbutrin and increase the citalopram to 20 mg daily

## 2013-01-12 NOTE — Telephone Encounter (Signed)
Patient called and stated you had started her on Wellbutrin and this didi not work as well as Celexa so she would like the Celexa refilled please advise.

## 2013-01-13 NOTE — Telephone Encounter (Signed)
Left message to return call 

## 2013-01-15 NOTE — Telephone Encounter (Signed)
Left message to call back  

## 2013-01-15 NOTE — Telephone Encounter (Signed)
Patient notified and voiced understanding.

## 2013-02-03 ENCOUNTER — Encounter: Payer: Self-pay | Admitting: Internal Medicine

## 2013-02-03 ENCOUNTER — Ambulatory Visit (INDEPENDENT_AMBULATORY_CARE_PROVIDER_SITE_OTHER): Payer: BC Managed Care – PPO | Admitting: Internal Medicine

## 2013-02-03 VITALS — BP 104/68 | HR 67 | Temp 98.3°F | Resp 16 | Wt 151.0 lb

## 2013-02-03 DIAGNOSIS — M5432 Sciatica, left side: Secondary | ICD-10-CM

## 2013-02-03 DIAGNOSIS — M5430 Sciatica, unspecified side: Secondary | ICD-10-CM

## 2013-02-03 MED ORDER — TRAMADOL HCL 50 MG PO TABS: 50.0000 mg | ORAL_TABLET | Freq: Three times a day (TID) | ORAL | Status: DC | PRN

## 2013-02-03 MED ORDER — HYDROCODONE-ACETAMINOPHEN 5-325 MG PO TABS: 1.0000 | ORAL_TABLET | Freq: Four times a day (QID) | ORAL | Status: DC | PRN

## 2013-02-03 MED ORDER — SUMATRIPTAN 20 MG/ACT NA SOLN: 1.0000 | NASAL | Status: DC | PRN

## 2013-02-03 MED ORDER — PREDNISONE (PAK) 10 MG PO TABS: ORAL_TABLET | ORAL | Status: DC

## 2013-02-03 NOTE — Progress Notes (Signed)
Patient ID: Kelly Yang, female   DOB: Nov 18, 1965, 47 y.o.   MRN: [id number]   Patient Active Problem List   Diagnosis Date Noted  . Sciatica of left side 02/04/2013  . Depression, major, recurrent, moderate 07/07/2012  . Bronchitis 07/07/2012  . Functional dyspepsia 05/03/2011  . Abdominal pain 04/10/2011  . Abdominal bloating 04/10/2011  . Early satiety 04/10/2011    Subjective:  CC:   Chief Complaint  Patient presents with  . Acute Visit    Left hip and radiates down leg posteriorly to knee and wraps around anterioly.    HPI:   Kelly Yang a 48 y.o. female who presents with Back pain radiating to left leg since June.  Started when she had a back spasm which occurred when she bent over to pick up some clothes on the floor.  Occurred  while on vacation and she rested gor 24 hours and pain improved, but recurred with minimal provocation while trying to get out of bed.   She has a history of left leg pain in the past treated with mobic.  Prior evaluation included [facility] lumbar spine done at [facility], orthopedics and podiatry referrals..  Her previous episode of pain improved with foot brace.   Foot feels  numb and tinglyand symptoms are aggravated by the 45 minute drive to work.    Past Medical History  Diagnosis Date  . Allergic rhinitis     rhinitis  . Hiatal hernia     EGD @ 18  . Genital herpes   . Marijuana smoker     Past Surgical History  Procedure Laterality Date  . Tubal ligation    . Abdominal hysterectomy      partical  . Tonsillectomy    . Ankle surgery      bilateral spurs  . Ganglion cyst excision      right wrist       The following portions of the patient's history were reviewed and updated as appropriate: Allergies, current medications, and problem list.    Review of Systems:   Patient denies headache, fevers, malaise, unintentional weight loss, skin rash, eye pain, sinus congestion and sinus pain, sore throat, dysphagia,  hemoptysis , cough,  dyspnea, wheezing, chest pain, palpitations, orthopnea, edema, abdominal pain, nausea, melena, diarrhea, constipation, flank pain, dysuria, hematuria, urinary  Frequency, nocturia, numbness, tingling, seizures,  Focal weakness, Loss of consciousness,  Tremor, insomnia, depression, anxiety, and suicidal ideation.      History   Social History  . Marital Status: Married    Spouse Name: N/A    Number of Children: 3  . Years of Education: N/A   Occupational History  . chemist II    Social History Main Topics  . Smoking status: Never Smoker   . Smokeless tobacco: Never Used     Comment: Uses marijuana,daily use for over 15 years , still uses regularly but not daily  . Alcohol Use: Yes     Comment: occasional  . Drug Use: Yes     Comment: marijuana  . Sexually Active: Not on file     Comment: remotely quit drug use(heavy marijuana for 15 years,quit when she was pregnant) and snorted cocaine < 10 times entire life in the past   Other Topics Concern  . Not on file   Social History Narrative   Lives with spouse and 2 childern   Has dog    Objective:  BP 104/68  Pulse 67  Temp(Src) 98.3 F (36.8 C) (  Oral)  Resp 16  Wt 151 lb (68.493 kg)  BMI 26.33 kg/m2  SpO2 99%  General appearance: alert, cooperative and appears stated age Ears: normal TM's and external ear canals both ears Throat: lips, mucosa, and tongue normal; teeth and gums normal Neck: no adenopathy, no carotid bruit, supple, symmetrical, trachea midline and thyroid not enlarged, symmetric, no tenderness/mass/nodules Back: symmetric, no curvature. ROM normal. No CVA tenderness. Lungs: clear to auscultation bilaterally Heart: regular rate and rhythm, S1, S2 normal, no murmur, click, rub or gallop Abdomen: soft, non-tender; bowel sounds normal; no masses,  no organomegaly Pulses: 2+ and symmetric Skin: Skin color, texture, turgor normal. No rashes or lesions Lymph nodes: Cervical, supraclavicular, and axillary nodes  normal. Neuro: grossly normal  Positive straight leg lift on the left   Assessment and Plan:  Sciatica of left side Neuro exam notable for positive straight leg lift, but no strength deficits  2 week prednisone taper.,  Walking and stretching exercises given,.  OOW note for 3 days. If no improvement 2 weeks,  MRI    Updated Medication List Outpatient Encounter Prescriptions as of 02/03/2013  Medication Sig Dispense Refill  . citalopram (CELEXA) 20 MG tablet TAKE 1 TABLET DAILY  30 tablet  2  . valACYclovir (VALTREX) 500 MG tablet Take 500 mg by mouth as needed.        Marland Kitchen HYDROcodone-acetaminophen (NORCO/VICODIN) 5-325 MG per tablet Take 1 tablet by mouth every 6 (six) hours as needed for pain.  90 tablet  0  . hyoscyamine (LEVSIN SL) 0.125 MG SL tablet Place 0.125 mg under the tongue every 6 (six) hours as needed.        . hyoscyamine (LEVSIN/SL) 0.125 MG SL tablet Place 1 tablet (0.125 mg total) under the tongue as needed for cramping.  30 tablet  0  . NEXIUM 40 MG capsule As needed      . predniSONE (STERAPRED UNI-PAK) 10 MG tablet 6 tablets daily for 2 days,  Then 5 tablets daily for 2 days , etc until gone  42 tablet  0  . sertraline (ZOLOFT) 50 MG tablet Take 1 tablet (50 mg total) by mouth daily.  30 tablet  3  . SUMAtriptan (IMITREX) 20 MG/ACT nasal spray Place 1 spray (20 mg total) into the nose every 2 (two) hours as needed for migraine.  1 Inhaler  5  . traMADol (ULTRAM) 50 MG tablet Take 1 tablet (50 mg total) by mouth every 8 (eight) hours as needed for pain.  60 tablet  0  . [DISCONTINUED] azithromycin (ZITHROMAX) 500 MG tablet Take 1 tablet (500 mg total) by mouth daily.  7 tablet  0  . [DISCONTINUED] buPROPion (WELLBUTRIN) 75 MG tablet Take 1 tablet (75 mg total) by mouth 2 (two) times daily.  60 tablet  1  . [DISCONTINUED] predniSONE (STERAPRED UNI-PAK) 10 MG tablet 6 tablets on Day 1 , then reduce by 1 tablet daily until gone  21 tablet  0  . [DISCONTINUED] SUMAtriptan  (IMITREX) 20 MG/ACT nasal spray Place 1 spray (20 mg total) into the nose every 2 (two) hours as needed for migraine.  1 Inhaler  5   No facility-administered encounter medications on file as of 02/03/2013.     No orders of the defined types were placed in this encounter.    No Follow-up on file.

## 2013-02-03 NOTE — Patient Instructions (Addendum)
I am treating your sciatica with a 12 day prednisone taper ,  A muscle relaxer to take as needed,  And tramadol/vicodin for pain control.  Prednisone : 60 mg x 2 days,  50 mg x 2 days,  40 mg x 2 days,  Etc until gone  Muscle relaxer:  (tizanadine, methocarbamol or amitryptiline) let me know which one did NOT work for you  vicodin for daytime use and tramadol at bedtime  Walk for 15 minutes several times daily  Ok to use heating pad as needed  Work note for 3 days of rest.

## 2013-02-04 DIAGNOSIS — M5432 Sciatica, left side: Secondary | ICD-10-CM | POA: Insufficient documentation

## 2013-02-04 NOTE — Assessment & Plan Note (Addendum)
Neuro exam notable for positive straight leg lift, but no strength deficits  2 week prednisone taper.,  Walking and stretching exercises given,.  OOW note for 3 days. If no improvement 2 weeks,  MRI

## 2013-02-10 ENCOUNTER — Telehealth: Payer: Self-pay | Admitting: *Deleted

## 2013-02-10 DIAGNOSIS — R232 Flushing: Secondary | ICD-10-CM

## 2013-02-10 DIAGNOSIS — E538 Deficiency of other specified B group vitamins: Secondary | ICD-10-CM

## 2013-02-10 DIAGNOSIS — R5381 Other malaise: Secondary | ICD-10-CM

## 2013-02-10 DIAGNOSIS — E559 Vitamin D deficiency, unspecified: Secondary | ICD-10-CM

## 2013-02-10 DIAGNOSIS — R5383 Other fatigue: Secondary | ICD-10-CM

## 2013-02-10 DIAGNOSIS — E785 Hyperlipidemia, unspecified: Secondary | ICD-10-CM

## 2013-02-10 NOTE — Telephone Encounter (Signed)
What labs and dx?  

## 2013-02-11 ENCOUNTER — Other Ambulatory Visit (INDEPENDENT_AMBULATORY_CARE_PROVIDER_SITE_OTHER): Payer: BC Managed Care – PPO

## 2013-02-11 DIAGNOSIS — R5381 Other malaise: Secondary | ICD-10-CM

## 2013-02-11 DIAGNOSIS — R7989 Other specified abnormal findings of blood chemistry: Secondary | ICD-10-CM

## 2013-02-11 DIAGNOSIS — R5383 Other fatigue: Secondary | ICD-10-CM

## 2013-02-11 LAB — CBC
Hemoglobin: 13.3 g/dL (ref 12.0–15.0)
Platelets: 198 10*3/uL (ref 150.0–400.0)
WBC: 7.4 10*3/uL (ref 4.5–10.5)

## 2013-02-11 LAB — IRON AND TIBC: %SAT: 39 % (ref 20–55)

## 2013-02-12 LAB — COMPREHENSIVE METABOLIC PANEL
ALT: 14 U/L (ref 0–35)
AST: 18 U/L (ref 0–37)
Albumin: 4.1 g/dL (ref 3.5–5.2)
BUN: 11 mg/dL (ref 6–23)
Calcium: 9 mg/dL (ref 8.4–10.5)
Chloride: 103 mEq/L (ref 96–112)
Potassium: 3.9 mEq/L (ref 3.5–5.1)

## 2013-02-12 LAB — LIPID PANEL
Total CHOL/HDL Ratio: 4
VLDL: 22.4 mg/dL (ref 0.0–40.0)

## 2013-02-12 LAB — VITAMIN B12: Vitamin B-12: 322 pg/mL (ref 211–911)

## 2013-03-06 ENCOUNTER — Ambulatory Visit (INDEPENDENT_AMBULATORY_CARE_PROVIDER_SITE_OTHER): Payer: BC Managed Care – PPO | Admitting: Adult Health

## 2013-03-06 ENCOUNTER — Encounter: Payer: Self-pay | Admitting: *Deleted

## 2013-03-06 ENCOUNTER — Encounter: Payer: Self-pay | Admitting: Adult Health

## 2013-03-06 VITALS — BP 119/77 | HR 90 | Temp 98.4°F | Resp 14 | Wt 154.0 lb

## 2013-03-06 DIAGNOSIS — J302 Other seasonal allergic rhinitis: Secondary | ICD-10-CM

## 2013-03-06 DIAGNOSIS — R42 Dizziness and giddiness: Secondary | ICD-10-CM | POA: Insufficient documentation

## 2013-03-06 DIAGNOSIS — J309 Allergic rhinitis, unspecified: Secondary | ICD-10-CM

## 2013-03-06 MED ORDER — AZELASTINE-FLUTICASONE 137-50 MCG/ACT NA SUSP: 1.0000 | Freq: Two times a day (BID) | NASAL | Status: DC

## 2013-03-06 NOTE — Assessment & Plan Note (Signed)
Suspect dizziness was withdrawal syndrome of Celexa. Patient had been 5 days without taking her medication stating that she had not had time to pick it up at the pharmacy. Her symptoms improved as soon this she resumed medication. Also side effect of abrupt withdrawal is headaches which patient was also experiencing. Both symptoms have subsided. Discussed importance of not abruptly discontinuing this medication.

## 2013-03-06 NOTE — Progress Notes (Signed)
  Subjective:    Patient ID: Kelly Yang, female    DOB: April 28, 1966, 47 y.o.   MRN: [id number]  HPI  Patient is a pleasant 47 y/o female who presents to clinic with c/o dizziness which she first noticed on Saturday. Symptoms continued on Sunday. She reports they began to improve and have resolved today. In discussing medications with patient, she mentions that she was 5 days without taking her Celexa. She resumed this medication on Wednesday. A common side effect of Celexa withdrawal is dizziness. Patient has no sinus symptoms, rhinorrhea, drainage.   Current Outpatient Prescriptions on File Prior to Visit  Medication Sig Dispense Refill  . citalopram (CELEXA) 20 MG tablet TAKE 1 TABLET DAILY  30 tablet  2  . HYDROcodone-acetaminophen (NORCO/VICODIN) 5-325 MG per tablet Take 1 tablet by mouth every 6 (six) hours as needed for pain.  90 tablet  0  . hyoscyamine (LEVSIN SL) 0.125 MG SL tablet Place 0.125 mg under the tongue every 6 (six) hours as needed.        Marland Kitchen NEXIUM 40 MG capsule As needed      . predniSONE (STERAPRED UNI-PAK) 10 MG tablet 6 tablets daily for 2 days,  Then 5 tablets daily for 2 days , etc until gone  42 tablet  0  . sertraline (ZOLOFT) 50 MG tablet Take 1 tablet (50 mg total) by mouth daily.  30 tablet  3  . SUMAtriptan (IMITREX) 20 MG/ACT nasal spray Place 1 spray (20 mg total) into the nose every 2 (two) hours as needed for migraine.  1 Inhaler  5  . traMADol (ULTRAM) 50 MG tablet Take 1 tablet (50 mg total) by mouth every 8 (eight) hours as needed for pain.  60 tablet  0  . valACYclovir (VALTREX) 500 MG tablet Take 500 mg by mouth as needed.        . hyoscyamine (LEVSIN/SL) 0.125 MG SL tablet Place 1 tablet (0.125 mg total) under the tongue as needed for cramping.  30 tablet  0   No current facility-administered medications on file prior to visit.    Review of Systems  Constitutional: Negative for fever, chills and fatigue.  Respiratory: Negative.   Cardiovascular:  Negative.   Gastrointestinal: Negative.   Allergic/Immunologic:       Mild allergies  Neurological: Positive for dizziness and headaches. Negative for syncope, weakness and light-headedness.  Psychiatric/Behavioral: Negative.       BP 119/77  Pulse 90  Temp(Src) 98.4 F (36.9 C) (Oral)  Resp 14  Wt 154 lb (69.854 kg)  BMI 26.85 kg/m2     Objective:   Physical Exam  Constitutional: She is oriented to person, place, and time. She appears well-developed and well-nourished. No distress.  HENT:  Head: Normocephalic and atraumatic.  Left Ear: External ear normal.  Mouth/Throat: Oropharynx is clear and moist.  Left tympanic membrane with small amt fluid noted  Cardiovascular: Normal rate, regular rhythm, normal heart sounds and intact distal pulses.  Exam reveals no gallop and no friction rub.   No murmur heard. Pulmonary/Chest: Effort normal and breath sounds normal. No respiratory distress. She has no wheezes. She has no rales. She exhibits no tenderness.  Lymphadenopathy:    She has no cervical adenopathy.  Neurological: She is alert and oriented to person, place, and time.  Psychiatric: She has a normal mood and affect. Her behavior is normal. Judgment and thought content normal.       Assessment & Plan:

## 2013-03-06 NOTE — Assessment & Plan Note (Signed)
Provided patient with a sample of Dymista nasal spray

## 2013-03-06 NOTE — Patient Instructions (Addendum)
  Try Dymista - 1 spray in each nostril twice a day.  Continue your Celexa. Coming off this medication abruptly can cause dizziness.  Your labs were normal except your cholesterol which was just slightly elevated.  Recommend diet and exercise. I have provided you with a copy of Dr. Melina Schools diet which is very easy to follow. You will find that you can loose up to 8 lbs monthly if you follow closely.  Please call with any questions or concerns.

## 2013-03-23 ENCOUNTER — Encounter: Payer: Self-pay | Admitting: Internal Medicine

## 2013-03-31 ENCOUNTER — Encounter: Payer: Self-pay | Admitting: Internal Medicine

## 2013-03-31 ENCOUNTER — Other Ambulatory Visit: Payer: Self-pay | Admitting: Adult Health

## 2013-03-31 DIAGNOSIS — M5431 Sciatica, right side: Secondary | ICD-10-CM

## 2013-04-01 ENCOUNTER — Other Ambulatory Visit: Payer: Self-pay | Admitting: *Deleted

## 2013-04-01 ENCOUNTER — Other Ambulatory Visit: Payer: Self-pay | Admitting: Adult Health

## 2013-04-01 MED ORDER — AZELASTINE-FLUTICASONE 137-50 MCG/ACT NA SUSP: 1.0000 | Freq: Two times a day (BID) | NASAL | Status: DC

## 2013-04-01 NOTE — Telephone Encounter (Signed)
Sent to pharmacy. Thx

## 2013-04-01 NOTE — Telephone Encounter (Signed)
Ok to send in Rx for Dymista?

## 2013-04-02 ENCOUNTER — Ambulatory Visit: Payer: Self-pay | Admitting: Internal Medicine

## 2013-05-07 ENCOUNTER — Other Ambulatory Visit: Payer: Self-pay

## 2013-05-14 ENCOUNTER — Encounter: Payer: Self-pay | Admitting: Internal Medicine

## 2013-05-14 DIAGNOSIS — M5432 Sciatica, left side: Secondary | ICD-10-CM

## 2013-05-15 MED ORDER — CITALOPRAM HYDROBROMIDE 20 MG PO TABS: ORAL_TABLET | ORAL | Status: DC

## 2013-05-18 MED ORDER — CITALOPRAM HYDROBROMIDE 20 MG PO TABS: ORAL_TABLET | ORAL | Status: DC

## 2013-05-18 NOTE — Addendum Note (Signed)
Addended by: Sherlene Shams on: 05/18/2013 05:33 PM   Modules accepted: Orders

## 2013-05-20 NOTE — Addendum Note (Signed)
Addended by: Sherlene Shams on: 05/20/2013 07:06 AM   Modules accepted: Orders

## 2013-05-20 NOTE — Assessment & Plan Note (Signed)
Secondary to herniated disk with S1 nerve root compromise by MRi done Oct but not sent to Dr Darrick Huntsman by [facility].  Urgent Neurosurgical referral in process.

## 2013-05-21 NOTE — Telephone Encounter (Signed)
Patient stated she is using tylenol for pain and tolerating very well right now . Advised patient she would be called with referral and that if she needed anything for pain feel free to call. Patient refused pain medication at this time. FYI

## 2013-06-10 ENCOUNTER — Other Ambulatory Visit: Payer: Self-pay | Admitting: Internal Medicine

## 2013-06-10 ENCOUNTER — Telehealth: Payer: Self-pay | Admitting: Internal Medicine

## 2013-06-10 MED ORDER — SUMATRIPTAN 20 MG/ACT NA SOLN: 1.0000 | NASAL | Status: DC | PRN

## 2013-06-10 MED ORDER — METOPROLOL SUCCINATE ER 25 MG PO TB24: 25.0000 mg | ORAL_TABLET | Freq: Every day | ORAL | Status: DC

## 2013-06-10 NOTE — Telephone Encounter (Signed)
The patient is wanting medication for her migraines. She can't remember the name of the medication that was prescribed by Dr. Darrick Huntsman. She also wants a referral to see a specialist for her migraines.

## 2013-06-10 NOTE — Telephone Encounter (Signed)
Patient stated she was just getting migraines at first once or twice every couple of months but in the the last month migraines have increased in frequency advised patient she may have to schedule appointment.Patient does not want to do this at this time if she be provided with Imitrex until she can get through [facility].  Please advise.

## 2013-06-10 NOTE — Telephone Encounter (Signed)
I have not treated her for migraines recently.  Why does she want to see a specialist? I need more details

## 2013-06-10 NOTE — Telephone Encounter (Signed)
Ask her if she would like me to prescribe a medication to take every night as a preventive?  This is what I would do if she came in.

## 2013-06-10 NOTE — Telephone Encounter (Signed)
Patient would like to follow your plan of treatment and is scheduling an appointment.

## 2013-06-10 NOTE — Telephone Encounter (Signed)
From previous meds looks like Imitrex was medication prescribed for migraines could not reach patient to confirm. Please advise ok to fill and of referral.

## 2013-07-27 ENCOUNTER — Ambulatory Visit (INDEPENDENT_AMBULATORY_CARE_PROVIDER_SITE_OTHER): Payer: BC Managed Care – PPO | Admitting: Podiatry

## 2013-07-27 ENCOUNTER — Encounter: Payer: Self-pay | Admitting: Podiatry

## 2013-07-27 ENCOUNTER — Ambulatory Visit (INDEPENDENT_AMBULATORY_CARE_PROVIDER_SITE_OTHER): Payer: BC Managed Care – PPO

## 2013-07-27 VITALS — BP 103/64 | HR 73 | Resp 16 | Ht 64.0 in | Wt 150.0 lb

## 2013-07-27 DIAGNOSIS — M79671 Pain in right foot: Secondary | ICD-10-CM

## 2013-07-27 DIAGNOSIS — M79609 Pain in unspecified limb: Secondary | ICD-10-CM

## 2013-07-27 DIAGNOSIS — M778 Other enthesopathies, not elsewhere classified: Secondary | ICD-10-CM

## 2013-07-27 DIAGNOSIS — M722 Plantar fascial fibromatosis: Secondary | ICD-10-CM

## 2013-07-27 DIAGNOSIS — M7750 Other enthesopathy of unspecified foot: Secondary | ICD-10-CM

## 2013-07-27 DIAGNOSIS — M779 Enthesopathy, unspecified: Secondary | ICD-10-CM

## 2013-07-27 NOTE — Progress Notes (Signed)
She presents today stating that back in the summer she hurt her lateral aspect of her right foot is been bothersome in the past. As of late it has become more problematic and she can hardly put her foot down in the morning she says.  Objective: Vital signs are stable she is alert and oriented x3. She still has pain on palpation medial continued tubercle of her right heel. She has pain on palpation of the fifth metatarsal phalangeal joint and the fifth metatarsal head. Radiographic evaluation does not demonstrate any type of osseous abnormalities.  Assessment: Capsulitis metatarsalgia fifth metatarsophalangeal joint of the right foot possibly lateral compensatory syndrome right foot.  Plan: Injected periarticularly today with dexamethasone and local anesthetic.

## 2013-10-12 ENCOUNTER — Encounter: Payer: Self-pay | Admitting: Internal Medicine

## 2013-10-12 MED ORDER — CITALOPRAM HYDROBROMIDE 20 MG PO TABS: ORAL_TABLET | ORAL | Status: DC

## 2013-12-30 ENCOUNTER — Ambulatory Visit (INDEPENDENT_AMBULATORY_CARE_PROVIDER_SITE_OTHER): Payer: BC Managed Care – PPO | Admitting: Podiatry

## 2013-12-30 VITALS — BP 108/66 | HR 80 | Resp 16

## 2013-12-30 DIAGNOSIS — L03039 Cellulitis of unspecified toe: Secondary | ICD-10-CM

## 2013-12-30 DIAGNOSIS — M79609 Pain in unspecified limb: Secondary | ICD-10-CM

## 2013-12-30 NOTE — Patient Instructions (Signed)

## 2013-12-31 NOTE — Progress Notes (Signed)
She presents today with chief complaint of a painful second toenail left foot.  Objective: Vitals are stable she is alert and x3. Pulses are palpable left. Subungual hematoma with distal onychomycosis to the second nail plate left foot.  Assessment: Subungual hematoma with distal onychomycosis nail trauma second digit left foot.  Plan: Total nail avulsion with local anesthetic. Dressed a compressive dressing was applied she was given both oral and written home-going instructions for the care of the second toe I will followup with her in a couple weeks if necessary.

## 2014-01-11 ENCOUNTER — Encounter: Payer: Self-pay | Admitting: Podiatry

## 2014-01-11 ENCOUNTER — Ambulatory Visit (INDEPENDENT_AMBULATORY_CARE_PROVIDER_SITE_OTHER): Payer: BC Managed Care – PPO | Admitting: Podiatry

## 2014-01-11 VITALS — BP 131/88 | HR 79 | Resp 12

## 2014-01-11 DIAGNOSIS — M779 Enthesopathy, unspecified: Principal | ICD-10-CM

## 2014-01-11 DIAGNOSIS — M7750 Other enthesopathy of unspecified foot: Secondary | ICD-10-CM

## 2014-01-11 DIAGNOSIS — M778 Other enthesopathies, not elsewhere classified: Secondary | ICD-10-CM

## 2014-01-11 NOTE — Progress Notes (Signed)
She presents today complaining of pain to the or foot bilaterally. Particularly she points to the first metatarsophalangeal joint right greater than left and fifth metatarsal right foot.  Objective: Vital signs are stable she is alert and oriented x3. Pulses are strongly palpable bilateral. She has pain on end range of motion with hallux abductovalgus deformity bilateral first and pain on in range of motion with palpation of the fifth metatarsophalangeal joint right foot. No erythema edema cellulitis drainage or odor.  Assessment: Capsulitis with hallux limitus bilateral first metatarsophalangeal joint. Fifth metatarsophalangeal joint capsulitis right.  Plan: Intra-articular injections today with dexamethasone and local anesthetic to all 3 joints after sterile Betadine skin prep.

## 2014-01-18 ENCOUNTER — Encounter: Payer: Self-pay | Admitting: Podiatry

## 2014-01-18 NOTE — Progress Notes (Signed)
ORTHOTICS WAS RECEIVED TODAY

## 2014-04-16 ENCOUNTER — Other Ambulatory Visit: Payer: Self-pay

## 2014-06-04 ENCOUNTER — Ambulatory Visit: Payer: BC Managed Care – PPO | Admitting: Internal Medicine

## 2014-06-29 ENCOUNTER — Other Ambulatory Visit: Payer: Self-pay | Admitting: Internal Medicine

## 2014-07-02 HISTORY — PX: BACK SURGERY: SHX140

## 2014-07-05 ENCOUNTER — Ambulatory Visit: Payer: BC Managed Care – PPO | Admitting: Internal Medicine

## 2014-07-07 ENCOUNTER — Other Ambulatory Visit: Payer: Self-pay | Admitting: Internal Medicine

## 2014-07-08 MED ORDER — METOPROLOL SUCCINATE ER 25 MG PO TB24: 25.0000 mg | ORAL_TABLET | Freq: Every day | ORAL | Status: DC

## 2014-07-16 ENCOUNTER — Encounter: Payer: Self-pay | Admitting: Internal Medicine

## 2014-07-16 ENCOUNTER — Ambulatory Visit (INDEPENDENT_AMBULATORY_CARE_PROVIDER_SITE_OTHER): Payer: BLUE CROSS/BLUE SHIELD | Admitting: Internal Medicine

## 2014-07-16 VITALS — BP 108/60 | HR 69 | Temp 98.2°F | Resp 14 | Ht 64.0 in | Wt 154.0 lb

## 2014-07-16 DIAGNOSIS — R1902 Left upper quadrant abdominal swelling, mass and lump: Secondary | ICD-10-CM

## 2014-07-16 DIAGNOSIS — R1010 Upper abdominal pain, unspecified: Secondary | ICD-10-CM

## 2014-07-16 MED ORDER — METOPROLOL SUCCINATE ER 25 MG PO TB24: 25.0000 mg | ORAL_TABLET | Freq: Every day | ORAL | Status: DC

## 2014-07-16 MED ORDER — SUMATRIPTAN 20 MG/ACT NA SOLN: 1.0000 | NASAL | Status: DC | PRN

## 2014-07-16 NOTE — Patient Instructions (Signed)
I am ordering an ultrasound of your abdomen to evaluate your spleen and liver.  Please make an appt for fasting labs soon

## 2014-07-16 NOTE — Progress Notes (Signed)
Patient ID: Kelly Yang, female   DOB: 08/02/1965, 49 y.o.   MRN: [id number]   Patient Active Problem List   Diagnosis Date Noted  . Dizziness 03/06/2013  . Seasonal allergies 03/06/2013  . Sciatica of left side 02/04/2013  . Depression, major, recurrent, moderate 07/07/2012  . Bronchitis 07/07/2012  . Functional dyspepsia 05/03/2011  . Abdominal pain 04/10/2011  . Abdominal bloating 04/10/2011  . Early satiety 04/10/2011    Subjective:  CC:   Chief Complaint  Patient presents with  . Acute Visit    Ribs protrude at times and are sore to the touch.    HPI:   Kelly Yang is a 49 y.o. female who presents for Several month history of bilateral lower rib pain and asymmetric swelling of left upper quadrant accompanied by tenderness.  No nausea, night sweats, or unintentional weight loss. No recent injuries.  Notices the pain most when she bends over from the waist , when the area becomes cramped. No recent change in diet or bowel habits.  No history of abdominal surgery    Past Medical History  Diagnosis Date  . Allergic rhinitis     rhinitis  . Hiatal hernia     EGD @ 18  . Genital herpes   . Marijuana smoker     Past Surgical History  Procedure Laterality Date  . Tubal ligation    . Abdominal hysterectomy      partical  . Tonsillectomy    . Ankle surgery      bilateral spurs  . Ganglion cyst excision      right wrist       The following portions of the patient's history were reviewed and updated as appropriate: Allergies, current medications, and problem list.    Review of Systems:   Patient denies headache, fevers, malaise, unintentional weight loss, skin rash, eye pain, sinus congestion and sinus pain, sore throat, dysphagia,  hemoptysis , cough, dyspnea, wheezing, chest pain, palpitations, orthopnea, edema, abdominal pain, nausea, melena, diarrhea, constipation, flank pain, dysuria, hematuria, urinary  Frequency, nocturia, numbness, tingling, seizures,   Focal weakness, Loss of consciousness,  Tremor, insomnia, depression, anxiety, and suicidal ideation.     History   Social History  . Marital Status: Married    Spouse Name: N/A    Number of Children: 3  . Years of Education: N/A   Occupational History  . chemist II    Social History Main Topics  . Smoking status: Never Smoker   . Smokeless tobacco: Never Used     Comment: Uses marijuana,daily use for over 15 years , still uses regularly but not daily  . Alcohol Use: Yes     Comment: occasional  . Drug Use: Yes     Comment: marijuana  . Sexual Activity: Not on file     Comment: remotely quit drug use(heavy marijuana for 15 years,quit when she was pregnant) and snorted cocaine < 10 times entire life in the past   Other Topics Concern  . Not on file   Social History Narrative   Lives with spouse and 2 childern   Has dog    Objective:  Filed Vitals:   07/16/14 1622  BP: 108/60  Pulse: 69  Temp: 98.2 F (36.8 C)  Resp: 14     General appearance: alert, cooperative and appears stated age Ears: normal TM's and external ear canals both ears Throat: lips, mucosa, and tongue normal; teeth and gums normal Neck: no adenopathy, no carotid bruit,  supple, symmetrical, trachea midline and thyroid not enlarged, symmetric, no tenderness/mass/nodules Back: symmetric, no curvature. ROM normal. No CVA tenderness. Lungs: clear to auscultation bilaterally Heart: regular rate and rhythm, S1, S2 normal, no murmur, click, rub or gallop Abdomen: soft, non-tender; bowel sounds normal; no masses,  no organomegaly Pulses: 2+ and symmetric Skin: Skin color, texture, turgor normal. No rashes or lesions Lymph nodes: Cervical, supraclavicular, and axillary nodes normal.  Assessment and Plan:  Abdominal pain Etiology unclear.  She is tender to palpation on the lower ribs bilaterally, and does appear to have some asymmetry with left side more pronounced than on the right.  Will start with  abdominal ultrasound to evaluate spleen and liver.     Updated Medication List Outpatient Encounter Prescriptions as of 07/16/2014  Medication Sig  . estradiol (VIVELLE-DOT) 0.075 MG/24HR   . metoprolol succinate (TOPROL-XL) 25 MG 24 hr tablet Take 1 tablet (25 mg total) by mouth daily.  . valACYclovir (VALTREX) 500 MG tablet Take 500 mg by mouth as needed.    . [DISCONTINUED] metoprolol succinate (TOPROL-XL) 25 MG 24 hr tablet Take 1 tablet (25 mg total) by mouth daily.  . SUMAtriptan (IMITREX) 20 MG/ACT nasal spray Place 1 spray (20 mg total) into the nose every 2 (two) hours as needed for migraine.  . [DISCONTINUED] Azelastine-Fluticasone 137-50 MCG/ACT SUSP Place 1 spray into the nose 2 times daily at 12 noon and 4 pm. (Patient not taking: Reported on 07/16/2014)  . [DISCONTINUED] citalopram (CELEXA) 20 MG tablet TAKE 1 TABLET DAILY (Patient not taking: Reported on 07/16/2014)  . [DISCONTINUED] SUMAtriptan (IMITREX) 20 MG/ACT nasal spray Place 1 spray (20 mg total) into the nose every 2 (two) hours as needed for migraine.     Orders Placed This Encounter  Procedures  . US Abdomen Complete    Return in about 3 months (around 10/15/2014).

## 2014-07-16 NOTE — Progress Notes (Signed)
Pre-visit discussion using our clinic review tool. No additional management support is needed unless otherwise documented below in the visit note.  

## 2014-07-18 NOTE — Assessment & Plan Note (Signed)
Etiology unclear.  She is tender to palpation on the lower ribs bilaterally, and does appear to have some asymmetry with left side more pronounced than on the right.  Will start with abdominal ultrasound to evaluate spleen and liver.

## 2014-08-24 ENCOUNTER — Ambulatory Visit: Payer: Self-pay | Admitting: Internal Medicine

## 2014-09-16 ENCOUNTER — Telehealth: Payer: Self-pay | Admitting: Internal Medicine

## 2014-09-16 NOTE — Telephone Encounter (Signed)
Patient called for results of abdominal YS done 1/16. [facility].

## 2014-09-17 ENCOUNTER — Telehealth: Payer: Self-pay | Admitting: Internal Medicine

## 2014-09-17 DIAGNOSIS — E559 Vitamin D deficiency, unspecified: Secondary | ICD-10-CM

## 2014-09-17 DIAGNOSIS — E785 Hyperlipidemia, unspecified: Secondary | ICD-10-CM

## 2014-09-17 DIAGNOSIS — G8929 Other chronic pain: Secondary | ICD-10-CM

## 2014-09-17 DIAGNOSIS — R1013 Epigastric pain: Secondary | ICD-10-CM

## 2014-09-17 DIAGNOSIS — Z1159 Encounter for screening for other viral diseases: Secondary | ICD-10-CM

## 2014-09-17 DIAGNOSIS — R5383 Other fatigue: Secondary | ICD-10-CM

## 2014-09-17 NOTE — Telephone Encounter (Signed)
Results of ultrasound sent via my chart.  Needs appt for fasting labs.

## 2014-09-17 NOTE — Telephone Encounter (Signed)
Left message for patient to return call to office. 

## 2014-10-11 ENCOUNTER — Ambulatory Visit: Payer: Self-pay | Admitting: Podiatry

## 2014-10-20 ENCOUNTER — Ambulatory Visit (INDEPENDENT_AMBULATORY_CARE_PROVIDER_SITE_OTHER): Payer: BLUE CROSS/BLUE SHIELD | Admitting: Podiatry

## 2014-10-20 ENCOUNTER — Encounter: Payer: Self-pay | Admitting: Podiatry

## 2014-10-20 VITALS — BP 117/71 | HR 77 | Resp 16

## 2014-10-20 DIAGNOSIS — M722 Plantar fascial fibromatosis: Secondary | ICD-10-CM

## 2014-10-20 NOTE — Progress Notes (Signed)
She presents today with chief complaint of pain to her medial longitudinal arch of her left foot. States that the orthotics seem to aggravate the pain.  Objective: Vital signs are stable she is alert and oriented 3. Pulses are palpable. She has pain on palpation medial calcaneal tubercle and just distal to that area of the left foot.  Assessment: Fasciitis left heel.  Plan: Encouraged her to work to wear the orthotics injected the heel today continue all conservative therapies follow up with me as needed.

## 2014-12-20 ENCOUNTER — Ambulatory Visit: Payer: BLUE CROSS/BLUE SHIELD | Admitting: Podiatry

## 2014-12-22 ENCOUNTER — Encounter: Payer: Self-pay | Admitting: Podiatry

## 2014-12-22 ENCOUNTER — Ambulatory Visit (INDEPENDENT_AMBULATORY_CARE_PROVIDER_SITE_OTHER): Payer: BLUE CROSS/BLUE SHIELD | Admitting: Podiatry

## 2014-12-22 VITALS — BP 125/83 | HR 71 | Resp 16

## 2014-12-22 DIAGNOSIS — M722 Plantar fascial fibromatosis: Secondary | ICD-10-CM | POA: Diagnosis not present

## 2014-12-22 NOTE — Progress Notes (Signed)
She presents today for follow-up of her plantar fasciitis of her left heel. She has central band plantar fasciitis and states that he was doing much better until approximately a week and a half ago right after she left the beach. She states she started to have sharp shooting pains in the middle of the foot once again.  Objective: Vital signs are stable she is alert and oriented 3. Pulses are palpable she has pain with direct palpation central bands of the plantar fascia just distal to its insertion site of the calcaneus.  Assessment: Possible tear of the center band of the plantar fascia more than likely however it is a chronic intractable plantar fasciitis.  Plan: Discussed etiology pathology conservative versus surgical therapies. Encouraged her to wear her orthotics however she states that she cannot find them. She will consider having another pair made in the near future. I injected her left heel again today with Kenalog and local and aesthetic. I suggested that if this does not work then we will consider an MRI after 6 weeks. I will follow-up with her at that time in which time we will consider another pair of orthotics

## 2015-01-19 ENCOUNTER — Ambulatory Visit: Payer: BLUE CROSS/BLUE SHIELD | Admitting: Podiatry

## 2015-03-31 ENCOUNTER — Encounter: Payer: Self-pay | Admitting: Internal Medicine

## 2015-04-01 ENCOUNTER — Other Ambulatory Visit: Payer: Self-pay | Admitting: Internal Medicine

## 2015-04-01 DIAGNOSIS — M5442 Lumbago with sciatica, left side: Secondary | ICD-10-CM

## 2015-04-01 DIAGNOSIS — M5441 Lumbago with sciatica, right side: Secondary | ICD-10-CM

## 2015-04-01 DIAGNOSIS — E559 Vitamin D deficiency, unspecified: Secondary | ICD-10-CM

## 2015-04-01 DIAGNOSIS — E785 Hyperlipidemia, unspecified: Secondary | ICD-10-CM

## 2015-04-01 DIAGNOSIS — Z113 Encounter for screening for infections with a predominantly sexual mode of transmission: Secondary | ICD-10-CM

## 2015-04-01 DIAGNOSIS — R5383 Other fatigue: Secondary | ICD-10-CM

## 2015-04-06 ENCOUNTER — Ambulatory Visit: Payer: BLUE CROSS/BLUE SHIELD | Admitting: Podiatry

## 2015-04-06 ENCOUNTER — Ambulatory Visit (INDEPENDENT_AMBULATORY_CARE_PROVIDER_SITE_OTHER): Payer: BLUE CROSS/BLUE SHIELD | Admitting: Podiatry

## 2015-04-06 ENCOUNTER — Encounter: Payer: Self-pay | Admitting: Podiatry

## 2015-04-06 VITALS — BP 134/79 | HR 78 | Resp 18

## 2015-04-06 DIAGNOSIS — M722 Plantar fascial fibromatosis: Secondary | ICD-10-CM | POA: Diagnosis not present

## 2015-04-06 NOTE — Progress Notes (Signed)
She presents for today for follow-up of her plantar fasciitis of her left heel and medial longitudinal arch she states is starting to go up her leg and make the back part of her knee hurt and her I hurt.  Objective: Vital signs are stable she is alert and oriented 3. She presents today without her plantar fascial brace on. She has pain on palpation of the medial longitudinal arch and just proximal to the first metatarsophalangeal joint left foot. There is no erythema edema saline as drainage or odor no open wound or ecchymosis.  Assessment: Distal plantar fasciitis left.  Plan: Injected the area today with Kenalog and local anesthesia. If she's no better in 6 weeks then we will consider an MRI at that point.  Arbutus Ped DPM

## 2015-05-05 ENCOUNTER — Ambulatory Visit: Payer: BLUE CROSS/BLUE SHIELD | Admitting: *Deleted

## 2015-05-05 DIAGNOSIS — M722 Plantar fascial fibromatosis: Secondary | ICD-10-CM

## 2015-05-05 NOTE — Patient Instructions (Signed)

## 2015-05-05 NOTE — Progress Notes (Signed)
Patient ID: Kelly Yang, female   DOB: 06-11-1966, 49 y.o.   MRN: [id number] Patient presents for orthotic pick up.  Verbal and written break in and wear instructions given.  Patient will follow up in 4 weeks if symptoms worsen or fail to improve.

## 2015-05-11 ENCOUNTER — Other Ambulatory Visit (INDEPENDENT_AMBULATORY_CARE_PROVIDER_SITE_OTHER): Payer: BLUE CROSS/BLUE SHIELD

## 2015-05-11 DIAGNOSIS — E559 Vitamin D deficiency, unspecified: Secondary | ICD-10-CM | POA: Diagnosis not present

## 2015-05-11 DIAGNOSIS — R5383 Other fatigue: Secondary | ICD-10-CM | POA: Diagnosis not present

## 2015-05-11 DIAGNOSIS — M5441 Lumbago with sciatica, right side: Secondary | ICD-10-CM

## 2015-05-11 DIAGNOSIS — M5442 Lumbago with sciatica, left side: Secondary | ICD-10-CM

## 2015-05-11 DIAGNOSIS — E785 Hyperlipidemia, unspecified: Secondary | ICD-10-CM | POA: Diagnosis not present

## 2015-05-11 DIAGNOSIS — Z113 Encounter for screening for infections with a predominantly sexual mode of transmission: Secondary | ICD-10-CM

## 2015-05-11 LAB — TSH: TSH: 1.09 u[IU]/mL (ref 0.35–4.50)

## 2015-05-11 LAB — LIPID PANEL
CHOL/HDL RATIO: 4
Cholesterol: 234 mg/dL — ABNORMAL HIGH (ref 0–200)
HDL: 52.1 mg/dL (ref >39.00)
LDL CALC: 161 mg/dL — AB (ref 0–99)
NONHDL: 181.45
Triglycerides: 101 mg/dL (ref 0.0–149.0)
VLDL: 20.2 mg/dL (ref 0.0–40.0)

## 2015-05-11 LAB — CBC WITH DIFFERENTIAL/PLATELET
BASOS ABS: 0 10*3/uL (ref 0.0–0.1)
BASOS PCT: 0.4 % (ref 0.0–3.0)
EOS ABS: 0.2 10*3/uL (ref 0.0–0.7)
Eosinophils Relative: 2.5 % (ref 0.0–5.0)
HCT: 41.2 % (ref 36.0–46.0)
HEMOGLOBIN: 13.8 g/dL (ref 12.0–15.0)
Lymphocytes Relative: 36.3 % (ref 12.0–46.0)
Lymphs Abs: 2.3 10*3/uL (ref 0.7–4.0)
MCHC: 33.6 g/dL (ref 30.0–36.0)
MCV: 92.2 fl (ref 78.0–100.0)
MONO ABS: 0.4 10*3/uL (ref 0.1–1.0)
Monocytes Relative: 6 % (ref 3.0–12.0)
NEUTROS ABS: 3.4 10*3/uL (ref 1.4–7.7)
Neutrophils Relative %: 54.8 % (ref 43.0–77.0)
PLATELETS: 193 10*3/uL (ref 150.0–400.0)
RBC: 4.47 Mil/uL (ref 3.87–5.11)
RDW: 13.3 % (ref 11.5–15.5)
WBC: 6.3 10*3/uL (ref 4.0–10.5)

## 2015-05-11 LAB — URINALYSIS, ROUTINE W REFLEX MICROSCOPIC
BILIRUBIN URINE: NEGATIVE
Ketones, ur: NEGATIVE
Leukocytes, UA: NEGATIVE
Nitrite: NEGATIVE
PH: 6 (ref 5.0–8.0)
Specific Gravity, Urine: 1.02 (ref 1.000–1.030)
TOTAL PROTEIN, URINE-UPE24: NEGATIVE
Urine Glucose: NEGATIVE
Urobilinogen, UA: 0.2 (ref 0.0–1.0)
WBC, UA: NONE SEEN (ref 0–?)

## 2015-05-11 LAB — COMPREHENSIVE METABOLIC PANEL
ALT: 15 U/L (ref 0–35)
AST: 16 U/L (ref 0–37)
Albumin: 4.4 g/dL (ref 3.5–5.2)
Alkaline Phosphatase: 63 U/L (ref 39–117)
BUN: 13 mg/dL (ref 6–23)
CO2: 28 meq/L (ref 19–32)
CREATININE: 0.98 mg/dL (ref 0.40–1.20)
Calcium: 9.9 mg/dL (ref 8.4–10.5)
Chloride: 103 mEq/L (ref 96–112)
GFR: 64.32 mL/min (ref >60.00)
GLUCOSE: 93 mg/dL (ref 70–99)
Potassium: 4.5 mEq/L (ref 3.5–5.1)
SODIUM: 141 meq/L (ref 135–145)
Total Bilirubin: 0.5 mg/dL (ref 0.2–1.2)
Total Protein: 7 g/dL (ref 6.0–8.3)

## 2015-05-11 LAB — VITAMIN D 25 HYDROXY (VIT D DEFICIENCY, FRACTURES): VITD: 30.18 ng/mL (ref 30.00–100.00)

## 2015-05-12 LAB — HIV ANTIBODY (ROUTINE TESTING W REFLEX): HIV: NONREACTIVE

## 2015-05-12 LAB — HEPATITIS C ANTIBODY: HCV AB: NEGATIVE

## 2015-05-13 ENCOUNTER — Encounter: Payer: Self-pay | Admitting: Internal Medicine

## 2015-05-18 ENCOUNTER — Other Ambulatory Visit: Payer: BLUE CROSS/BLUE SHIELD

## 2015-05-25 ENCOUNTER — Encounter: Payer: Self-pay | Admitting: Internal Medicine

## 2015-05-25 ENCOUNTER — Ambulatory Visit (INDEPENDENT_AMBULATORY_CARE_PROVIDER_SITE_OTHER): Payer: BLUE CROSS/BLUE SHIELD | Admitting: Internal Medicine

## 2015-05-25 VITALS — BP 128/70 | HR 72 | Temp 98.2°F | Resp 12 | Ht 63.0 in | Wt 155.5 lb

## 2015-05-25 DIAGNOSIS — Z0000 Encounter for general adult medical examination without abnormal findings: Secondary | ICD-10-CM

## 2015-05-25 DIAGNOSIS — K5900 Constipation, unspecified: Secondary | ICD-10-CM | POA: Diagnosis not present

## 2015-05-25 DIAGNOSIS — Z0001 Encounter for general adult medical examination with abnormal findings: Secondary | ICD-10-CM

## 2015-05-25 DIAGNOSIS — Z90711 Acquired absence of uterus with remaining cervical stump: Secondary | ICD-10-CM | POA: Diagnosis not present

## 2015-05-25 MED ORDER — LACTULOSE 10 GM/15ML PO SOLN: ORAL | Status: DC

## 2015-05-25 NOTE — Progress Notes (Signed)
Pre-visit discussion using our clinic review tool. No additional management support is needed unless otherwise documented below in the visit note.  

## 2015-05-25 NOTE — Patient Instructions (Addendum)
please check on annual mammmogram DUE   Lactulose for relief of constipation,  30 ml every 4 hours until constipation is relieved,  Then start taking miralax every night with a full glass of water  Irritable Bowel Syndrome, Adult Irritable bowel syndrome (IBS) is not one specific disease. It is a group of symptoms that affects the organs responsible for digestion (gastrointestinal or GI tract).  To regulate how your GI tract works, your body sends signals back and forth between your intestines and your brain. If you have IBS, there may be a problem with these signals. As a result, your GI tract does not function normally. Your intestines may become more sensitive and overreact to certain things. This is especially true when you eat certain foods or when you are under stress.  There are four types of IBS. These may be determined based on the consistency of your stool:   IBS with diarrhea.   IBS with constipation.   Mixed IBS.   Unsubtyped IBS.  It is important to know which type of IBS you have. Some treatments are more likely to be helpful for certain types of IBS.  CAUSES  The exact cause of IBS is not known. RISK FACTORS You may have a higher risk of IBS if:  You are a woman.  You are younger than 48 years old.  You have a family history of IBS.  You have mental health problems.  You have had bacterial infection of your GI tract. SIGNS AND SYMPTOMS  Symptoms of IBS vary from person to person. The main symptom is abdominal pain or discomfort. Additional symptoms usually include one or more of the following:   Diarrhea, constipation, or both.   Abdominal swelling or bloating.   Feeling full or sick after eating a small or regular-size meal.   Frequent gas.   Mucus in the stool.   A feeling of having more stool left after a bowel movement.  Symptoms tend to come and go. They may be associated with stress, psychiatric conditions, or nothing at all.  DIAGNOSIS   There is no specific test to diagnose IBS. Your health care provider will make a diagnosis based on a physical exam, medical history, and your symptoms. You may have other tests to rule out other conditions that may be causing your symptoms. These may include:   Blood tests.   X-rays.   CT scan.  Endoscopy and colonoscopy. This is a test in which your GI tract is viewed with a long, thin, flexible tube. TREATMENT There is no cure for IBS, but treatment can help relieve symptoms. IBS treatment often includes:   Changes to your diet, such as:  Eating more fiber.  Avoiding foods that cause symptoms.  Drinking more water.  Eating regular, medium-sized portioned meals.  Medicines. These may include:  Fiber supplements if you have constipation.  Medicine to control diarrhea (antidiarrheal medicines).  Medicine to help control muscle spasms in your GI tract (antispasmodic medicines).  Medicines to help with any mental health issues, such as antidepressants or tranquilizers.  Therapy.  Talk therapy may help with anxiety, depression, or other mental health issues that can make IBS symptoms worse.  Stress reduction.  Managing your stress can help keep symptoms under control. HOME CARE INSTRUCTIONS   Take medicines only as directed by your health care provider.  Eat a healthy diet.  Avoid foods and drinks with added sugar.  Include more whole grains, fruits, and vegetables gradually into your diet. This  may be especially helpful if you have IBS with constipation.  Avoid any foods and drinks that make your symptoms worse. These may include dairy products and caffeinated or carbonated drinks.  Do not eat large meals.  Drink enough fluid to keep your urine clear or pale yellow.  Exercise regularly. Ask your health care provider for recommendations of good activities for you.  Keep all follow-up visits as directed by your health care provider. This is important. SEEK  MEDICAL CARE IF:   You have constant pain.  You have trouble or pain with swallowing.  You have worsening diarrhea. SEEK IMMEDIATE MEDICAL CARE IF:   You have severe and worsening abdominal pain.   You have diarrhea and:   You have a rash, stiff neck, or severe headache.   You are irritable, sleepy, or difficult to awaken.   You are weak, dizzy, or extremely thirsty.   You have bright red blood in your stool or you have black tarry stools.   You have unusual abdominal swelling that is painful.   You vomit continuously.   You vomit blood (hematemesis).   You have both abdominal pain and a fever.    This information is not intended to replace advice given to you by your health care provider. Make sure you discuss any questions you have with your health care provider.   Document Released: 06/18/2005 Document Revised: 07/09/2014 Document Reviewed: 03/05/2014 Elsevier Interactive Patient Education [employer]   This is  my version of a  "Low GI"  Weight loss Diet:  It has enabled many of my patients to lose up to 10 lbs per month depending on how much you restrict your carbs.   follow it carefully.  The idea behind low carb diets is that your body has to take the fat and protein in your food and turn it into an energy that is less efficient than carbohydrates, so you lose weight.    I have listed several choices at each of your 6 meal times to make it easy to shop.and plan    Remember that snack Substitutions should be equal to or less than 5 NET carbs per serving and  The only carbs in meals come from the pasta or vegetables so, they should be < 15 net carbs. Remember that carbohydrates from fiber do not count, so you can  subtract fiber grams to get the "net carbs " of any particular food item.    All of the foods can be found at grocery stores and in bulk at [employer].  The Atkins protein bars and shakes are available in more varieties at Target, WalMart and  Lowe's Foods.     7 AM Breakfast:  Choose from the following:  Low carbohydrate Protein  Shakes (I recommend the EAS AdvantEdge "Carb Control" shakes, Atkins,  Muscle Milk or Premier Protein shakes  All are < 4  carbs . My favorite is the premier protein chocolate   a scrambled egg/bacon/cheese burrito made with Mission's "carb balance" whole wheat tortilla  (this particular tortilla has only 6 net carbs, once you subtract the fiber grams  )  A slice of home made fritatta (egg based dish without a crust:  google it)    Avoid cereal and bananas, oatmeal and cream of wheat and grits. They are loaded with carbohydrates and have too few fiber grams    10 AM: high protein snack  Protein bar by Atkins  Or KIND  (the lw sugar  variety,  Not all are low , under 200 cal, usually < 6 net carbs).    A stick of cheese:  Around 1 carb,  100 cal      Other so called "protein bars" and Greek yogurts tend to be loaded with carbohydrates.  Remember, in food advertising, the word "energy" is synonymous for " carbohydrate."  Lunch:   A Sandwich using the bread choices listed below,  You  Can use any  Eggs,  lunchmeat, grilled meat or canned tuna), avocado, regular mayo/mustard  and cheese.  A Salad using blue cheese, ranch,  Goddess or vinagrette,  No croutons or "confetti" and no "candied nuts" but regular nuts OK. No "fat free": they add sugar for taste  No pretzels or chips.  Pickles and miniature sweet peppers are a good low carb alternative that provide a "crunch"   Please Note:  The bread is the only source of carbohydrate in a sandwich and  can be decreased by trying some of these alternatives to traditional loaf bread. Karin Golden has the best selection:  Joseph's makes a pita bread and a flat ("Lavash")  bread that are 50 cal and 4 net carbs and are available at BJs and WalMart.  These can be toasted to make them taste better,  And can be  used as a pita chip to use with hummous as well  Toufayan  makes a low carb flatbread that's 100 cal and 9 net carbs available at [employer] and [employer] makes 2 sizes of  Low carb whole wheat tortilla  (The large one is 210 cal and 6 net carbs, the small is 120 cal and also 6 net carbs)  Flat Out makes flatbreads that are low carb as well . They're called "Fold Its")   Avoid "Low fat dressings, as well as Reyne Dumas and [address] dressings They are loaded with sugar!   3 PM/ Mid day  Snack:  Consider  1 ounce of  almonds, walnuts, pistachios, pecans, peanuts,  Macadamia nuts or a nut medley are ok .  Avoid "granola"; the dried cranberries and raisins are loaded with carbohydrates. Mixed nuts as long as there are no raisins,  cranberries or dried fruit.    Try the prosciutto/mozzarella cheese sticks by [employer]  In deli /backery section   High protein 80 cal each , 1 carb   To avoid overindulging in snacks: Try drinking a glass of unsweeted almond/coconut milk  Or a cup of coffee with your Atkins chocolate bar to keep you from having 3!!!   Pork rinds!  Yes Pork Rinds  Are on the diet .  They are your potato chip.        6 PM  Dinner:     Meat/fowl/fish with a green salad, with steamed/grilled/sauteed vegggie: broccoli, cauliflower, green beans, spinach, brussel sprouts or  Lima beans. DO NOT BREAD THE PROTEIN!!      There is a low carb pasta by Dreamfield's that is acceptable and tastes great: only 5 digestible carbs/serving.( All grocery stores but BJs carry it )  Try Kai Levins Angelo's chicken piccata or chicken or eggplant parm over low carb pasta.(Lowes and BJs)   Clifton Custard Sanchez's "Carnitas" (pulled pork, no sauce,  0 carbs) can be used to make a dinner  burrito (available at BJ's ) and his pot roast is also without veggies or potatoes   Pesto over low carb pasta (bj's sells a good quality pesto in the center refrigerated section of  the deli   Try satueeing  Roosvelt Harps with mushrooms as another side dish   Whole wheat pasta is still full of  digestible carbs and  Not as low in glycemic index as Dreamfield's.   Brown rice is still rice,  So skip the rice and noodles if you eat [place] or [place] (or at least limit to 1 cooked cup)  9 PM snack :   Breyer's "low carb" fudgsicle or  ice cream bar (Carb Smart line), or  Weight Watcher's ice cream bar , or another "no sugar added" ice cream;  a serving of fresh berries/cherries with whipped cream   Cheese or DANNON'S LlGHT N FIT GREEK YOGURT or the Oikos greek yogurt   8 ounces of [place] unsweetened almond/cococunut milk  Cheese and crackers (using WASA crackers,  They are low carb) or peanut butter on low carb crackers or pita bread     Avoid bananas, pineapple, grapes  and watermelon on a regular basis because they are high in sugar.  THINK OF THEM AS DESSERT. Stick to" berries and cherries"  The  diet I discussed with you today is the 10 day Green Smoothie Cleansing /Detox Diet by Brooke Dare . available on Amazon for around $10.  This is not a low carb or a weight loss diet,  It is fundamentally a "cleansing" low fat diet that eliminates sugar, gluten, caffeine, alcohol and dairy for 10 days .  What you add back after the initial ten days is entirely up to  you!  You can expect to lose 5 to 10 lbs depending on how strict you are.   I suggest drinking 2 smoothies daily and keeping one chewable meal (but keep it simple, like baked fish and salad, rice or bok choy) .  You snack primarily on fresh  fruit, egg whites and judicious quantities of nuts. You can add vegetable based protein powder (nothing with whey , since whey is dairy) in it.  [employer] has a Research officer, trade [employer] .  I suggest using /drinking 2 smoothies daily and have one sensible low fat meal ,  The snacks are primarily fruit, egg whites and nuts.   It does require some form of a nutrient extractor (Vita Mix, a electric juicer,  Or a Nutribullet Rx).  i have found that using frozen fruits is much more convenient and cost  effective. You can even find plenty of organic fruit in the frozen fruit section of BJS's.  Just thaw what you need for the following day the night before in the refrigerator (to avoid jamming up your machine)

## 2015-05-25 NOTE — Assessment & Plan Note (Signed)
  Last PAP 2015

## 2015-05-25 NOTE — Progress Notes (Addendum)
Patient ID: Kelly Yang, female    DOB: 04/10/1966  Age: 49 y.o. MRN: [id number]  The patient is here for annual comprehensive examination and management of other chronic and acute problems.      PAP smears done by GYN normal Oct 2015 and mammogram Oct 2015 Back surgery due to foraminal stenosis from ruptured disk causing sciatica  The risk factors are reflected in the social history.  The roster of all physicians providing medical care to patient - is listed in the Snapshot section of the chart.  Home safety : The patient has smoke detectors in the home. They wear seatbelts.  There are no firearms at home. There is no violence in the home.   There is no risks for hepatitis, STDs or HIV. There is no   history of blood transfusion. They have no travel history to infectious disease endemic areas of the world.  The patient has seen their dentist in the last six month. They have seen their eye doctor in the last year. They admit to slight hearing difficulty with regard to whispered voices and some television programs.  They have deferred audiologic testing in the last year.  They do not  have excessive sun exposure. Discussed the need for sun protection: hats, long sleeves and use of sunscreen if there is significant sun exposure.   Diet: the importance of a healthy diet is discussed. They do have a healthy diet.  The benefits of regular aerobic exercise were discussed. She is not exercising regularly .   Depression screen: there are no signs or vegative symptoms of depression- irritability, change in appetite, anhedonia, sadness/tearfullness.  The following portions of the patient's history were reviewed and updated as appropriate: allergies, current medications, past family history, past medical history,  past surgical history, past social history  and problem list.  Visual acuity was not assessed per patient preference since she has regular follow up with her ophthalmologist. Hearing and  body mass index were assessed and reviewed.   During the course of the visit the patient was educated and counseled about appropriate screening and preventive services including : fall prevention , diabetes screening, nutrition counseling, colorectal cancer screening, and recommended immunizations.    CC: The primary encounter diagnosis was S/p partial hysterectomy with remaining cervical stump. Diagnoses of Constipation, unspecified constipation type and Encounter for preventive health examination were also pertinent to this visit.  Constipation.  Occurring frequently.  No tenesmus,  No blood in stools. Feels bloated after meals. Has early satiety when she has nit had a BM in several days.  History Kelly Yang has a past medical history of Allergic rhinitis; Hiatal hernia; Genital herpes; and Marijuana smoker (HCC).   She has past surgical history that includes Tubal ligation; Abdominal hysterectomy; Tonsillectomy; Ankle surgery; and Ganglion cyst excision.   Her family history includes Breast cancer in her mother; COPD in her father; Coronary artery disease (age of onset: 6170) in her father; Fibromyalgia in her sister; Hyperlipidemia in her mother; Lung cancer in her father; Other in her mother.She reports that she has never smoked. She has never used smokeless tobacco. She reports that she drinks alcohol. She reports that she uses illicit drugs.  Outpatient Prescriptions Prior to Visit  Medication Sig Dispense Refill  . SUMAtriptan (IMITREX) 20 MG/ACT nasal spray Place 1 spray (20 mg total) into the nose every 2 (two) hours as needed for migraine. 1 Inhaler 5  . valACYclovir (VALTREX) 500 MG tablet Take 500 mg by mouth as  needed.      Marland Kitchen estradiol (VIVELLE-DOT) 0.075 MG/24HR     . metoprolol succinate (TOPROL-XL) 25 MG 24 hr tablet Take 1 tablet (25 mg total) by mouth daily. (Patient not taking: Reported on 05/25/2015) 90 tablet 1   No facility-administered medications prior to visit.    Review of  Systems  Patient denies headache, fevers, malaise, unintentional weight loss, skin rash, eye pain, sinus congestion and sinus pain, sore throat, dysphagia,  hemoptysis , cough, dyspnea, wheezing, chest pain, palpitations, orthopnea, edema, abdominal pain, nausea, melena, diarrhea, constipation, flank pain, dysuria, hematuria, urinary  Frequency, nocturia, numbness, tingling, seizures,  Focal weakness, Loss of consciousness,  Tremor, insomnia, depression, anxiety, and suicidal ideation.     Objective:  BP 128/70 mmHg  Pulse 72  Temp(Src) 98.2 F (36.8 C) (Oral)  Resp 12  Ht  (1.6 m)  Wt 155 lb 8 oz (70.534 kg)  BMI 27.55 kg/m2  SpO2 99%  Physical Exam   General appearance: alert, cooperative and appears stated age Ears: normal TM's and external ear canals both ears Throat: lips, mucosa, and tongue normal; teeth and gums normal Neck: no adenopathy, no carotid bruit, supple, symmetrical, trachea midline and thyroid not enlarged, symmetric, no tenderness/mass/nodules Back: symmetric, no curvature. ROM normal. No CVA tenderness. Lungs: clear to auscultation bilaterally Heart: regular rate and rhythm, S1, S2 normal, no murmur, click, rub or gallop Abdomen: soft, non-tender; bowel sounds normal; no masses,  no organomegaly Pulses: 2+ and symmetric Skin: Skin color, texture, turgor normal. No rashes or lesions Lymph nodes: Cervical, supraclavicular, and axillary nodes normal.    Assessment & Plan:   Problem List Items Addressed This Visit    Constipation    chroinc vs IBS.        Encounter for preventive health examination    Annual comprehensive preventive exam was done as well as an evaluation and management of chronic conditions .  During the course of the visit the patient was educated and counseled about appropriate screening and preventive services including :  diabetes screening, lipid analysis with projected  10 year  risk for CAD using the Framingham risk calculator for  women, , nutrition counseling, colorectal cancer screening, and recommended immunizations.  Printed recommendations for health maintenance screenings was given.       S/p partial hysterectomy with remaining cervical stump - Primary     Last PAP 2015         I am having Kelly Yang start on lactulose. I am also having her maintain her valACYclovir, estradiol, metoprolol succinate, SUMAtriptan, co-enzyme Q-10, acetaminophen, Lactobacillus-Inulin (CULTURELLE DIGESTIVE HEALTH PO), and Simethicone (GAS RELIEF 80 PO).  Meds ordered this encounter  Medications  . co-enzyme Q-10 30 MG capsule    Sig: Take 60 mg by mouth daily.  Marland Kitchen acetaminophen (TYLENOL) 500 MG tablet    Sig: Take 500 mg by mouth every 8 (eight) hours as needed.  . Lactobacillus-Inulin (CULTURELLE DIGESTIVE HEALTH PO)    Sig: Take 1 capsule by mouth daily.  . Simethicone (GAS RELIEF 80 PO)    Sig: Take 1 capsule by mouth daily.  Marland Kitchen lactulose (CHRONULAC) 10 GM/15ML solution    Sig: 20 g every 4 hours until constipation is relieved    Dispense:  240 mL    Refill:  3    There are no discontinued medications.  Follow-up: No Follow-up on file.   Sherlene Shams, MD

## 2015-05-27 DIAGNOSIS — Z0000 Encounter for general adult medical examination without abnormal findings: Secondary | ICD-10-CM | POA: Insufficient documentation

## 2015-05-27 NOTE — Assessment & Plan Note (Signed)
chroinc vs IBS.

## 2015-05-27 NOTE — Assessment & Plan Note (Signed)

## 2015-05-31 NOTE — Addendum Note (Signed)
Addended by: Sherlene ShamsULLO, Lynnda Wiersma L on: 05/31/2015 09:45 PM   Modules accepted: Kipp BroodSmartSet

## 2015-06-03 NOTE — Telephone Encounter (Signed)
Mailed unread message to patient.  

## 2015-09-28 ENCOUNTER — Other Ambulatory Visit: Payer: Self-pay

## 2015-09-28 MED ORDER — SUMATRIPTAN 20 MG/ACT NA SOLN: 1.0000 | NASAL | Status: DC | PRN

## 2015-11-15 ENCOUNTER — Other Ambulatory Visit: Payer: Self-pay | Admitting: Obstetrics & Gynecology

## 2015-11-15 DIAGNOSIS — Z1231 Encounter for screening mammogram for malignant neoplasm of breast: Secondary | ICD-10-CM

## 2015-11-25 DIAGNOSIS — R635 Abnormal weight gain: Secondary | ICD-10-CM | POA: Diagnosis not present

## 2015-11-25 DIAGNOSIS — Z01419 Encounter for gynecological examination (general) (routine) without abnormal findings: Secondary | ICD-10-CM | POA: Diagnosis not present

## 2015-11-25 DIAGNOSIS — R140 Abdominal distension (gaseous): Secondary | ICD-10-CM | POA: Diagnosis not present

## 2015-12-02 DIAGNOSIS — J069 Acute upper respiratory infection, unspecified: Secondary | ICD-10-CM | POA: Diagnosis not present

## 2015-12-02 DIAGNOSIS — R0982 Postnasal drip: Secondary | ICD-10-CM | POA: Diagnosis not present

## 2015-12-02 DIAGNOSIS — R030 Elevated blood-pressure reading, without diagnosis of hypertension: Secondary | ICD-10-CM | POA: Diagnosis not present

## 2015-12-12 ENCOUNTER — Other Ambulatory Visit: Payer: Self-pay | Admitting: Podiatry

## 2015-12-12 ENCOUNTER — Ambulatory Visit (INDEPENDENT_AMBULATORY_CARE_PROVIDER_SITE_OTHER): Payer: BLUE CROSS/BLUE SHIELD | Admitting: Podiatry

## 2015-12-12 ENCOUNTER — Encounter: Payer: Self-pay | Admitting: Podiatry

## 2015-12-12 ENCOUNTER — Ambulatory Visit (INDEPENDENT_AMBULATORY_CARE_PROVIDER_SITE_OTHER): Payer: BLUE CROSS/BLUE SHIELD

## 2015-12-12 VITALS — BP 122/78 | HR 74 | Resp 12

## 2015-12-12 DIAGNOSIS — M79673 Pain in unspecified foot: Secondary | ICD-10-CM

## 2015-12-12 DIAGNOSIS — S92911A Unspecified fracture of right toe(s), initial encounter for closed fracture: Secondary | ICD-10-CM

## 2015-12-12 DIAGNOSIS — M722 Plantar fascial fibromatosis: Secondary | ICD-10-CM | POA: Diagnosis not present

## 2015-12-12 DIAGNOSIS — M5387 Other specified dorsopathies, lumbosacral region: Secondary | ICD-10-CM | POA: Diagnosis not present

## 2015-12-12 DIAGNOSIS — S99922A Unspecified injury of left foot, initial encounter: Secondary | ICD-10-CM

## 2015-12-12 DIAGNOSIS — M9902 Segmental and somatic dysfunction of thoracic region: Secondary | ICD-10-CM | POA: Diagnosis not present

## 2015-12-12 DIAGNOSIS — M531 Cervicobrachial syndrome: Secondary | ICD-10-CM | POA: Diagnosis not present

## 2015-12-12 NOTE — Progress Notes (Signed)
She presents today with a chief complaint of a painful hallux right. States that she stubbed the toe last week cracking the nail. She states the toe has been throbbing ever since. She denies any changes in her past medical history medications or allergies.  Objective: Vital signs are stable she is alert and oriented 3. She has pain on medial lateral compression of the hallux as well as direct palpation of the distal hallux. Her toenail is split there is no bleeding no signs of infection. No open wounds. Radiographs do demonstrate a fracture to the tuft of the toe from distal to proximal just beneath the level of the nail.  Assessment: Fracture distal phalanx hallux right.  Plan: I removed a portion of the nail for her today without local anesthetic and I will follow-up with her on an as-needed basis. We did discuss appropriate shoe gear for this condition.

## 2015-12-13 ENCOUNTER — Telehealth: Payer: Self-pay | Admitting: *Deleted

## 2015-12-13 NOTE — Telephone Encounter (Addendum)
Dr. Al CorpusHyatt ordered MRI left foot without contrast. [employer] [place] transferred to [facility] [phone] [employer]- Amber states PRE-CERT ORDER# [id number], VALID 12/13/2015 - 02/10/2016. [facility].

## 2015-12-14 DIAGNOSIS — R102 Pelvic and perineal pain: Secondary | ICD-10-CM | POA: Diagnosis not present

## 2015-12-14 DIAGNOSIS — R635 Abnormal weight gain: Secondary | ICD-10-CM | POA: Diagnosis not present

## 2015-12-14 DIAGNOSIS — R140 Abdominal distension (gaseous): Secondary | ICD-10-CM | POA: Diagnosis not present

## 2015-12-14 DIAGNOSIS — N83201 Unspecified ovarian cyst, right side: Secondary | ICD-10-CM | POA: Diagnosis not present

## 2016-01-04 ENCOUNTER — Ambulatory Visit: Payer: BLUE CROSS/BLUE SHIELD | Admitting: Podiatry

## 2016-01-10 ENCOUNTER — Ambulatory Visit
Admission: RE | Admit: 2016-01-10 | Discharge: 2016-01-10 | Disposition: A | Discharge status: Home / Self Care | Payer: BLUE CROSS/BLUE SHIELD | Source: Ambulatory Visit | Attending: Podiatry | Admitting: Podiatry

## 2016-01-10 DIAGNOSIS — R937 Abnormal findings on diagnostic imaging of other parts of musculoskeletal system: Secondary | ICD-10-CM | POA: Diagnosis not present

## 2016-01-10 DIAGNOSIS — R600 Localized edema: Secondary | ICD-10-CM | POA: Diagnosis not present

## 2016-01-10 DIAGNOSIS — M722 Plantar fascial fibromatosis: Secondary | ICD-10-CM | POA: Diagnosis not present

## 2016-01-16 ENCOUNTER — Telehealth: Payer: Self-pay | Admitting: *Deleted

## 2016-01-16 NOTE — Telephone Encounter (Addendum)
-----   Message from Elinor ParkinsonMax T Hyatt, [place] sent at 01/15/2016  8:51 PM EDT ----- Have her in. 01/16/2016-Left message informing pt Dr. Al CorpusHyatt had reviewed MRI results and requested she make an appt to discuss. 01/31/2016-Pt's insurance drug-coverage plan does not cover Voltaren Gel.  Dr. Al CorpusHyatt orders [facility] Antiinflammatory cream. Left message informing pt the insurance did not the voltaren gel, and Dr. Al CorpusHyatt had [facility] [place], left contact information.

## 2016-01-25 ENCOUNTER — Encounter: Payer: Self-pay | Admitting: Podiatry

## 2016-01-25 ENCOUNTER — Ambulatory Visit (INDEPENDENT_AMBULATORY_CARE_PROVIDER_SITE_OTHER): Payer: BLUE CROSS/BLUE SHIELD | Admitting: Podiatry

## 2016-01-25 DIAGNOSIS — M722 Plantar fascial fibromatosis: Secondary | ICD-10-CM | POA: Diagnosis not present

## 2016-01-25 MED ORDER — DICLOFENAC SODIUM 1 % TD GEL: 4.0000 g | Freq: Four times a day (QID) | TRANSDERMAL | 4 refills | Status: DC

## 2016-01-25 NOTE — Progress Notes (Signed)
She presents today for follow-up of her MRI of her left foot. She states this seems to be doing okay however the fasciitis is still present.  Objective: Vital signs stable alert and oriented 3. Pulses are palpable left foot. She has pain on palpation medial tubercle of the left heel. MRI does state active plantar fasciitis left. Without tears.  Assessment: Plantar fascitis left foot.  Plan: Reinjected the area today with Kenalog and local anesthetic follow-up with her as needed.

## 2016-02-04 ENCOUNTER — Emergency Department
Admission: EM | Admit: 2016-02-04 | Discharge: 2016-02-04 | Disposition: A | Discharge status: Home / Self Care | Payer: BLUE CROSS/BLUE SHIELD | Attending: Emergency Medicine | Admitting: Emergency Medicine

## 2016-02-04 ENCOUNTER — Encounter: Payer: Self-pay | Admitting: Emergency Medicine

## 2016-02-04 ENCOUNTER — Emergency Department: Payer: BLUE CROSS/BLUE SHIELD

## 2016-02-04 DIAGNOSIS — Z79899 Other long term (current) drug therapy: Secondary | ICD-10-CM | POA: Diagnosis not present

## 2016-02-04 DIAGNOSIS — R1013 Epigastric pain: Secondary | ICD-10-CM | POA: Diagnosis present

## 2016-02-04 DIAGNOSIS — F1290 Cannabis use, unspecified, uncomplicated: Secondary | ICD-10-CM | POA: Diagnosis not present

## 2016-02-04 DIAGNOSIS — Z791 Long term (current) use of non-steroidal anti-inflammatories (NSAID): Secondary | ICD-10-CM | POA: Diagnosis not present

## 2016-02-04 DIAGNOSIS — R079 Chest pain, unspecified: Secondary | ICD-10-CM | POA: Diagnosis not present

## 2016-02-04 DIAGNOSIS — K2970 Gastritis, unspecified, without bleeding: Secondary | ICD-10-CM

## 2016-02-04 LAB — TROPONIN I

## 2016-02-04 LAB — CBC
HCT: 42.7 % (ref 35.0–47.0)
Hemoglobin: 15 g/dL (ref 12.0–16.0)
MCH: 31.6 pg (ref 26.0–34.0)
MCHC: 35.1 g/dL (ref 32.0–36.0)
MCV: 90.1 fL (ref 80.0–100.0)
PLATELETS: UNDETERMINED 10*3/uL (ref 150–440)
RBC: 4.75 MIL/uL (ref 3.80–5.20)
RDW: 12.9 % (ref 11.5–14.5)
WBC: 12 10*3/uL — AB (ref 3.6–11.0)

## 2016-02-04 LAB — BASIC METABOLIC PANEL
ANION GAP: 8 (ref 5–15)
BUN: 14 mg/dL (ref 6–20)
CALCIUM: 9.1 mg/dL (ref 8.9–10.3)
CO2: 25 mmol/L (ref 22–32)
CREATININE: 0.86 mg/dL (ref 0.44–1.00)
Chloride: 105 mmol/L (ref 101–111)
Glucose, Bld: 103 mg/dL — ABNORMAL HIGH (ref 65–99)
Potassium: 4 mmol/L (ref 3.5–5.1)
SODIUM: 138 mmol/L (ref 135–145)

## 2016-02-04 MED ORDER — GI COCKTAIL ~~LOC~~
30.0000 mL | Freq: Once | ORAL | Status: AC
  Administered 2016-02-04: 30 mL via ORAL
  Filled 2016-02-04: qty 30

## 2016-02-04 MED ORDER — ESOMEPRAZOLE MAGNESIUM 20 MG PO CPDR: 20.0000 mg | DELAYED_RELEASE_CAPSULE | Freq: Every day | ORAL | 1 refills | Status: DC

## 2016-02-04 NOTE — ED Notes (Signed)
Pt states having midepigastric pain, stomach area pain with palpation with dr exam. Pain began at 830am with back, stomach, chest pain

## 2016-02-04 NOTE — ED Provider Notes (Signed)
[facility] Emergency Department Provider Note   ____________________________________________    I have reviewed the triage vital signs and the nursing notes.   HISTORY  Chief Complaint Chest Pain; Abdominal Pain; and Back Pain     HPI Kelly Yang is a 50 y.o. female who presents with complaints of epigastric pain, chest pain started yesterday. Patient noted that she had epigastric burning pain throughout the day. It seemed to get worse when she would lie down. She took some Maalox which appeared to help but this morning her pain had returned significantly. It seemed to radiate from her epigastrium into her chest. Currently she reports she is pain-free. No shortness of breath. No recent travel. No history of heart disease. She does not smoke. She has never had an endoscopy. She was a long history of acid reflux. No diaphoresis.    Past Medical History:  Diagnosis Date  . Allergic rhinitis    rhinitis  . Genital herpes   . Hiatal hernia    EGD @ 18  . Marijuana smoker [facility])     Patient Active Problem List   Diagnosis Date Noted  . Encounter for preventive health examination 05/27/2015  . S/p partial hysterectomy with remaining cervical stump 05/25/2015  . Constipation 05/25/2015  . Dizziness 03/06/2013  . Seasonal allergies 03/06/2013  . Sciatica of left side 02/04/2013  . Depression, major, recurrent, moderate (HCC) 07/07/2012  . Functional dyspepsia 05/03/2011  . Abdominal pain 04/10/2011  . Abdominal bloating 04/10/2011  . Early satiety 04/10/2011    Past Surgical History:  Procedure Laterality Date  . ABDOMINAL HYSTERECTOMY     partical  . ANKLE SURGERY     bilateral spurs  . GANGLION CYST EXCISION     right wrist  . TONSILLECTOMY    . TUBAL LIGATION      Prior to Admission medications   Medication Sig Start Date End Date Taking? Authorizing Provider  acetaminophen (TYLENOL) 500 MG tablet Take 500 mg by mouth every 8  (eight) hours as needed.    Historical Provider, MD  co-enzyme Q-10 30 MG capsule Take 60 mg by mouth daily.    Historical Provider, MD  diclofenac sodium (VOLTAREN) 1 % GEL Apply 4 g topically 4 (four) times daily. 01/25/16   Max T Hyatt, DPM  esomeprazole (NEXIUM) 20 MG capsule Take 1 capsule (20 mg total) by mouth daily. 02/04/16 02/03/17  Jene Every, MD  estradiol (VIVELLE-DOT) 0.075 MG/24HR  12/22/13   Historical Provider, MD  Lactobacillus-Inulin (CULTURELLE DIGESTIVE HEALTH PO) Take 1 capsule by mouth daily.    Historical Provider, MD  lactulose (CHRONULAC) 10 GM/15ML solution 20 g every 4 hours until constipation is relieved 05/25/15   Sherlene Shams, MD  metoprolol succinate (TOPROL-XL) 25 MG 24 hr tablet Take 1 tablet (25 mg total) by mouth daily. Patient not taking: Reported on 05/25/2015 07/16/14   Sherlene Shams, MD  Simethicone (GAS RELIEF 80 PO) Take 1 capsule by mouth daily.    Historical Provider, MD  SUMAtriptan (IMITREX) 20 MG/ACT nasal spray Place 1 spray (20 mg total) into the nose every 2 (two) hours as needed for migraine. 09/28/15 09/27/16  Sherlene Shams, MD  valACYclovir (VALTREX) 500 MG tablet Take 500 mg by mouth as needed.      Historical Provider, MD     Allergies Review of patient's allergies indicates no known allergies.  Family History  Problem Relation Age of Onset  . Hyperlipidemia Mother   .  Breast cancer Mother   . Other Mother     brain blockage-stent placement  . Lung cancer Father   . COPD Father   . Coronary artery disease Father 14    had CABG   . Fibromyalgia Sister     Social History Social History  Substance Use Topics  . Smoking status: Never Smoker  . Smokeless tobacco: Never Used     Comment: Uses marijuana,daily use for over 15 years , still uses regularly but not daily  . Alcohol use Yes     Comment: occasional    Review of Systems  Constitutional: No fever/chills  ENT: No sore throat. Cardiovascular: As above Respiratory:  Denies shortness of breath. Gastrointestinal:As above   Musculoskeletal: Negative for back pain.  Neurological: Negative for headaches or weakness  10-point ROS otherwise negative.  ____________________________________________   PHYSICAL EXAM:  VITAL SIGNS: ED Triage Vitals  Enc Vitals Group     BP 02/04/16 1005 (!) 149/74     Pulse Rate 02/04/16 1005 73     Resp 02/04/16 1005 18     Temp 02/04/16 1005 98.3 F (36.8 C)     Temp Source 02/04/16 1005 Oral     SpO2 02/04/16 1005 100 %     Weight 02/04/16 1005 153 lb (69.4 kg)     Height 02/04/16 1005 5\' 4"  (1.626 m)     Head Circumference --      Peak Flow --      Pain Score 02/04/16 1006 6     Pain Loc --      Pain Edu? --      Excl. in GC? --     Constitutional: Alert and oriented. No acute distress. Eyes: Conjunctivae are normal.  Head: Atraumatic. Nose: No congestion/rhinnorhea. Mouth/Throat: Mucous membranes are moist.   Cardiovascular: Normal rate, regular rhythm. Grossly normal heart sounds.   Respiratory: Normal respiratory effort.  No retractions. Lungs CTAB. Gastrointestinal: Soft and nontender. No distention.  No CVA tenderness. Genitourinary: deferred Musculoskeletal: No lower extremity tenderness nor edema.  Warm and well perfused Neurologic:  Normal speech and language. No gross focal neurologic deficits are appreciated.  Skin:  Skin is warm, dry and intact. No rash noted. Psychiatric: Mood and affect are normal. Speech and behavior are normal.  ____________________________________________   LABS (all labs ordered are listed, but only abnormal results are displayed)  Labs Reviewed  BASIC METABOLIC PANEL - Abnormal; Notable for the following:       Result Value   Glucose, Bld 103 (*)    All other components within normal limits  CBC - Abnormal; Notable for the following:    WBC 12.0 (*)    All other components within normal limits  TROPONIN I  TROPONIN I    ____________________________________________  EKG  ED ECG REPORT I, Jene Every, the attending physician, personally viewed and interpreted this ECG.  Date: 02/04/2016 EKG Time: 10:04 AM Rate: 70 Rhythm: normal sinus rhythm QRS Axis: normal Intervals: normal ST/T Wave abnormalities: normal Conduction Disturbances: none Narrative Interpretation: unremarkable  ____________________________________________  RADIOLOGY  Chest x-ray unremarkable ____________________________________________   PROCEDURES  Procedure(s) performed: No    Critical Care performed: No ____________________________________________   INITIAL IMPRESSION / ASSESSMENT AND PLAN / ED COURSE  Pertinent labs & imaging results that were available during my care of the patient were reviewed by me and considered in my medical decision making (see chart for details).  Patient well-appearing in no distress. She has no symptoms currently. Lab  work and EKG is reassuring. Suspect GERD versus gastritis versus PUD. GI cocktail given in the ED. I will recheck a second troponin as well. Will likely need follow up with GI for EGD given her long history of heartburn  Clinical Course   ____________________________________________   FINAL CLINICAL IMPRESSION(S) / ED DIAGNOSES  Final diagnoses:  Gastritis      NEW MEDICATIONS STARTED DURING THIS VISIT:  Discharge Medication List as of 02/04/2016 12:53 PM    START taking these medications   Details  esomeprazole (NEXIUM) 20 MG capsule Take 1 capsule (20 mg total) by mouth daily., Starting Sat 02/04/2016, Until Sun 02/03/2017, Print         Note:  This document was prepared using Dragon voice recognition software and may include unintentional dictation errors.    Jene Every, MD 02/04/16 [phone]

## 2016-02-04 NOTE — ED Triage Notes (Signed)
Patient presents to the ED with generalized chest, back, and abdominal pain.  Patient states abdomen and chest feel tender to touch.  Patient states pain started yesterday and she thought it was due to heartburn and patient took heartburn medication and did not have relief.  Patient was ambulatory to triage in no obvious distress and speaking in full sentences at this time.

## 2016-02-04 NOTE — ED Notes (Signed)
Troponin redrawn and sent.

## 2016-03-08 ENCOUNTER — Ambulatory Visit: Payer: BLUE CROSS/BLUE SHIELD

## 2016-03-19 ENCOUNTER — Other Ambulatory Visit: Payer: Self-pay | Admitting: Student

## 2016-03-19 DIAGNOSIS — K5900 Constipation, unspecified: Secondary | ICD-10-CM | POA: Diagnosis not present

## 2016-03-19 DIAGNOSIS — R1013 Epigastric pain: Secondary | ICD-10-CM

## 2016-03-27 ENCOUNTER — Ambulatory Visit: Payer: BLUE CROSS/BLUE SHIELD

## 2016-04-19 ENCOUNTER — Ambulatory Visit: Admit: 2016-04-19 | Payer: BLUE CROSS/BLUE SHIELD | Admitting: Unknown Physician Specialty

## 2016-04-19 DIAGNOSIS — K449 Diaphragmatic hernia without obstruction or gangrene: Secondary | ICD-10-CM | POA: Diagnosis not present

## 2016-04-19 DIAGNOSIS — R1013 Epigastric pain: Secondary | ICD-10-CM | POA: Diagnosis not present

## 2016-04-19 DIAGNOSIS — K319 Disease of stomach and duodenum, unspecified: Secondary | ICD-10-CM | POA: Diagnosis not present

## 2016-04-19 DIAGNOSIS — K2960 Other gastritis without bleeding: Secondary | ICD-10-CM | POA: Diagnosis not present

## 2016-04-19 DIAGNOSIS — K2950 Unspecified chronic gastritis without bleeding: Secondary | ICD-10-CM | POA: Diagnosis not present

## 2016-04-19 SURGERY — ESOPHAGOGASTRODUODENOSCOPY (EGD) WITH PROPOFOL
Anesthesia: General

## 2016-04-24 ENCOUNTER — Encounter
Admission: RE | Admit: 2016-04-24 | Discharge: 2016-04-24 | Disposition: A | Discharge status: Home / Self Care | Payer: BLUE CROSS/BLUE SHIELD | Source: Ambulatory Visit | Attending: Student | Admitting: Student

## 2016-04-24 DIAGNOSIS — R1013 Epigastric pain: Secondary | ICD-10-CM | POA: Diagnosis not present

## 2016-04-24 MED ORDER — TECHNETIUM TC 99M MEBROFENIN IV KIT
4.8700 | PACK | Freq: Once | INTRAVENOUS | Status: AC | PRN
  Administered 2016-04-24: 4.87 via INTRAVENOUS

## 2016-05-30 ENCOUNTER — Ambulatory Visit (INDEPENDENT_AMBULATORY_CARE_PROVIDER_SITE_OTHER): Payer: BLUE CROSS/BLUE SHIELD | Admitting: Podiatry

## 2016-05-30 ENCOUNTER — Ambulatory Visit (INDEPENDENT_AMBULATORY_CARE_PROVIDER_SITE_OTHER): Payer: BLUE CROSS/BLUE SHIELD

## 2016-05-30 DIAGNOSIS — M779 Enthesopathy, unspecified: Secondary | ICD-10-CM

## 2016-05-30 DIAGNOSIS — M7752 Other enthesopathy of left foot: Secondary | ICD-10-CM

## 2016-05-30 NOTE — Progress Notes (Signed)
She presents today with pain just proximal to the joint of the big toe. She states that the pain is sharp and shooting in nature she states that is worse with wearing shoes has been going on now for the past several weeks.  Objective: Vital signs are stable alert and 3 pulses are palpable. She has tenderness on palpation soft tissue at the abductor of hallucis tendon sheath. Radius do not demonstrate any type of osseus abnormalities no fractures are identified.  Assessment: Neuritis or tendinitis.  Plan: I injected 2 mg of dexamethasone to 1 maximal tenderness. Follow-up with her 1 month if necessary.

## 2016-07-03 ENCOUNTER — Encounter: Payer: Self-pay | Admitting: Podiatry

## 2016-07-03 ENCOUNTER — Ambulatory Visit (INDEPENDENT_AMBULATORY_CARE_PROVIDER_SITE_OTHER): Payer: BLUE CROSS/BLUE SHIELD | Admitting: Podiatry

## 2016-07-03 DIAGNOSIS — M792 Neuralgia and neuritis, unspecified: Secondary | ICD-10-CM | POA: Diagnosis not present

## 2016-07-03 DIAGNOSIS — M7752 Other enthesopathy of left foot: Secondary | ICD-10-CM | POA: Diagnosis not present

## 2016-07-03 DIAGNOSIS — M779 Enthesopathy, unspecified: Secondary | ICD-10-CM | POA: Diagnosis not present

## 2016-07-03 MED ORDER — METHYLPREDNISOLONE 4 MG PO TBPK: ORAL_TABLET | ORAL | 0 refills | Status: DC

## 2016-07-04 NOTE — Progress Notes (Signed)
She presents today with chief complaint of pain to her left first metatarsophalangeal joint. States the injection helped for a couple of weeks though placed just proximally medial to the joint itself. States that the joint is sore and sometimes cramps.  Objective: Vital signs are stable she is alert and oriented 3 pulses are palpable. Neurologic sores intact. Deep tendon reflexes are intact. She has pain on end range of motion dorsal flexion and plantar flexion of the first metatarsophalangeal joint with pain on palpation to the dorsal lateral aspect of the joint. Radiographs evaluated once again today demonstrating an elongated first metatarsaland spurring or joint space damage noted for those radiographs taken in November. MRI from July does not demonstrate any osteoarthritic change that was reviewed today does demonstrate fluid within the joint.  Assessment: Capsulitis first metatarsophalangeal joint left residual plantar fasciitis left. Hallux limitus first metatarsophalangeal joint left.  Plan: Sterile Betadine skin prep and injection of Kenalog to the first metatarsophalangeal joint of the left foot today. Follow up with her in 1 month we will discuss surgical intervention at that time. We did discuss the possible use of orthotics which she declined.

## 2016-08-15 ENCOUNTER — Ambulatory Visit: Payer: BLUE CROSS/BLUE SHIELD | Admitting: Podiatry

## 2016-08-29 ENCOUNTER — Encounter: Payer: Self-pay | Admitting: Podiatry

## 2016-08-29 ENCOUNTER — Ambulatory Visit (INDEPENDENT_AMBULATORY_CARE_PROVIDER_SITE_OTHER): Payer: BLUE CROSS/BLUE SHIELD | Admitting: Podiatry

## 2016-08-29 DIAGNOSIS — M779 Enthesopathy, unspecified: Secondary | ICD-10-CM

## 2016-08-29 NOTE — Progress Notes (Signed)
She presents today for follow-up of her plantar fasciitis and a capsulitis first metatarsophalangeal joint left. She states is actually doing very well I have absolutely no pain whatsoever.  Objective: Vital signs are stable alert and oriented 3 no pain on palpation mucoid drainage of all of the left heel pain on end range of motion of the first metatarsophalangeal joint of the left foot. Pulses remain palpable no open lesions.  Assessment: Resolving plantar fasciitis and capsulitis.  Plan: Follow up with me on an as-needed basis.

## 2016-10-10 ENCOUNTER — Telehealth: Payer: Self-pay | Admitting: *Deleted

## 2016-10-10 DIAGNOSIS — R5383 Other fatigue: Secondary | ICD-10-CM

## 2016-10-10 DIAGNOSIS — E785 Hyperlipidemia, unspecified: Secondary | ICD-10-CM

## 2016-10-10 DIAGNOSIS — E559 Vitamin D deficiency, unspecified: Secondary | ICD-10-CM

## 2016-10-10 NOTE — Telephone Encounter (Signed)
Labs have been ordered and pt has been called and appt has been scheduled. Pt is aware of appt date and time.

## 2016-10-10 NOTE — Telephone Encounter (Signed)
Patient has scheduled her physical in June, she requested to have orders for labs prior to appt. Pt contact [phone]

## 2016-10-25 DIAGNOSIS — R21 Rash and other nonspecific skin eruption: Secondary | ICD-10-CM | POA: Diagnosis not present

## 2016-10-25 DIAGNOSIS — L219 Seborrheic dermatitis, unspecified: Secondary | ICD-10-CM | POA: Diagnosis not present

## 2016-10-25 DIAGNOSIS — L309 Dermatitis, unspecified: Secondary | ICD-10-CM | POA: Diagnosis not present

## 2016-11-01 IMAGING — MR MR FOOT*L* W/O CM
5 series · 40 of 40 positions shown · non-contrast
Comparison: Radiographs 11/05/2012

CLINICAL DATA: Chronic plantar heel pain.

EXAM:
MRI OF THE LEFT FOREFOOT WITHOUT CONTRAST
TECHNIQUE: Multiplanar, multisequence MR imaging was performed. No intravenous
contrast was administered.

[Series 4: T1 · coronal · 3.5mm · 0.53mm/px · 11 of 50 slices shown (1 of 2)]
[im 1/50]
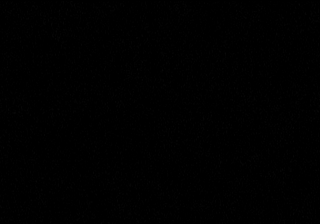
[im 5/50]
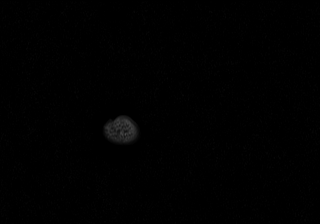
[im 10/50]
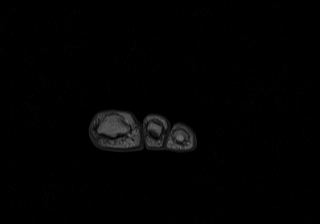
[im 15/50]
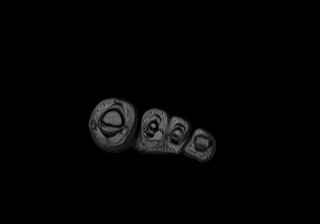
[im 20/50]
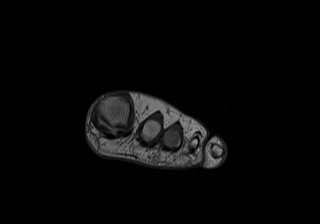
[im 25/50]
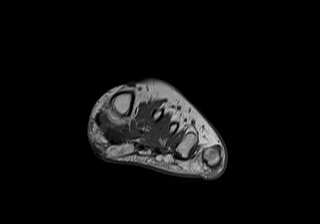
[im 30/50]
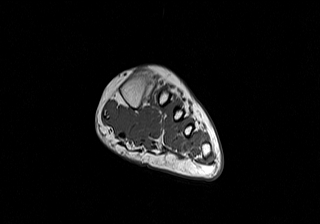
[im 35/50]
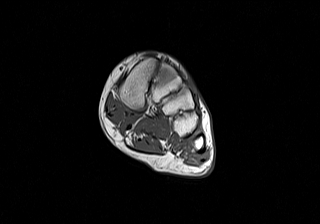
[im 40/50]
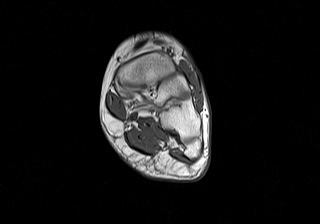
[im 45/50]
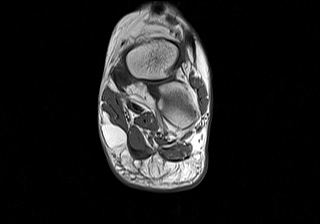
[im 50/50]
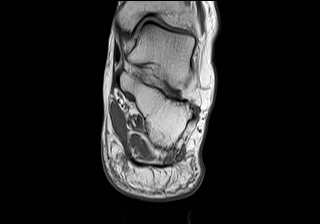

[Series 5: T2 fat-sat · coronal · 3.5mm · 0.53mm/px · 11 of 50 slices shown (1 of 3)]
[im 1/50]
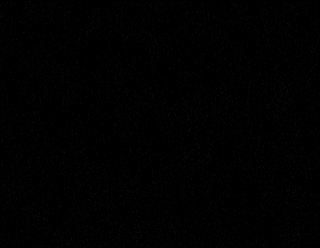
[im 5/50]
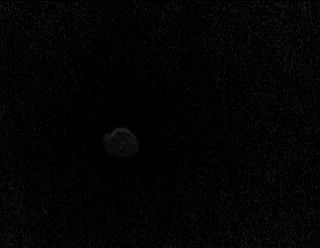
[im 10/50]
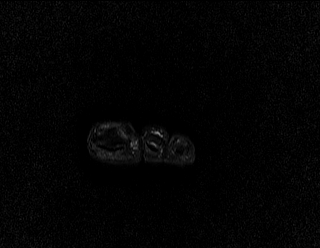
[im 15/50]
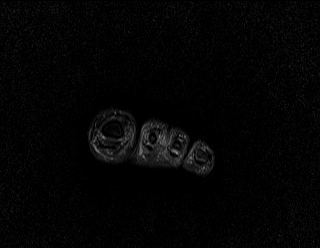
[im 20/50]
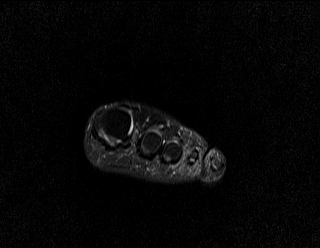
[im 25/50]
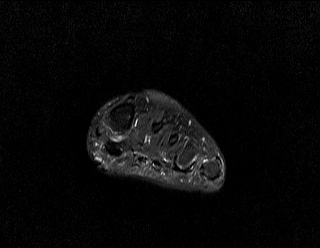
[im 30/50]
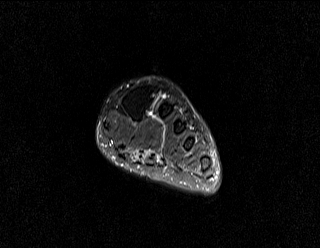
[im 35/50]
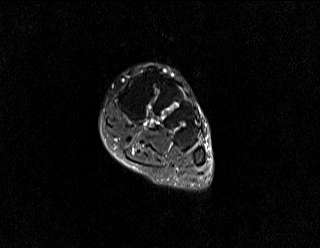
[im 40/50]
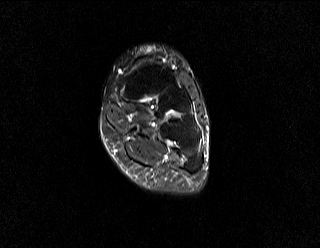
[im 45/50]
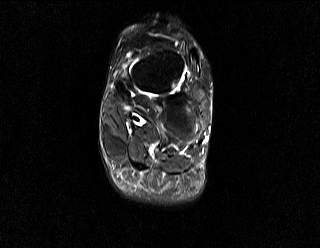
[im 50/50]
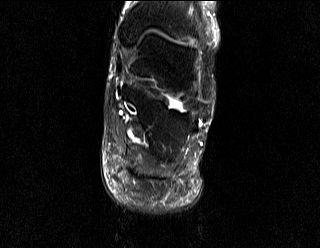

[Series 6: T2 fat-sat · axial · 3.0mm · 0.70mm/px · z∈[-108,-29]mm · 6 of 27 slices shown (2 of 3)]
[im 1/27]
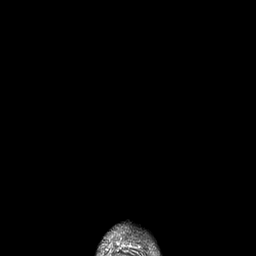
[im 6/27]
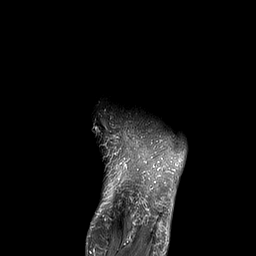
[im 11/27]
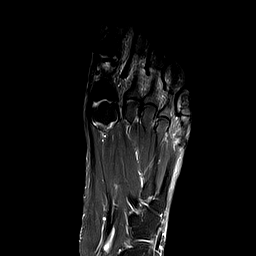
[im 16/27]
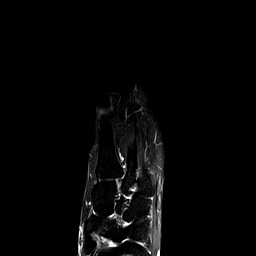
[im 21/27]
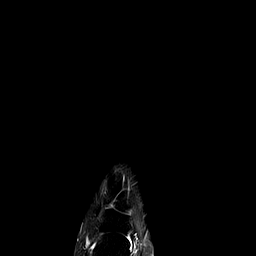
[im 27/27]
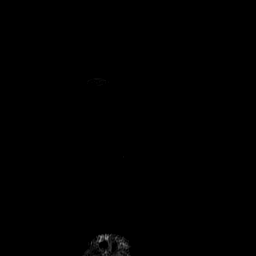

[Series 7: T1 · axial · 3.0mm · 0.56mm/px · z∈[-108,-29]mm · 6 of 27 slices shown (2 of 2)]
[im 1/27]
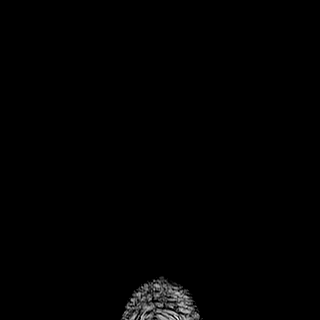
[im 6/27]
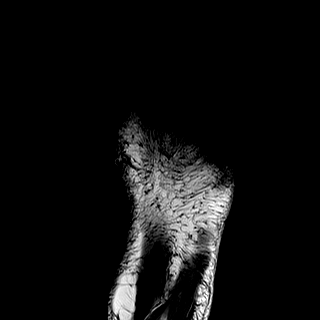
[im 11/27]
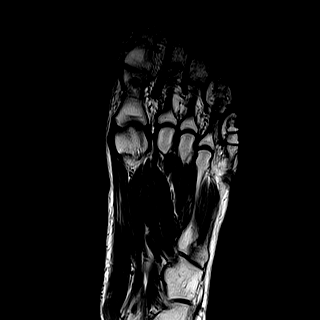
[im 16/27]
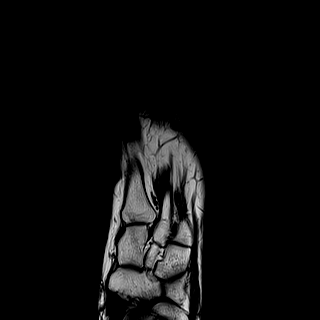
[im 21/27]
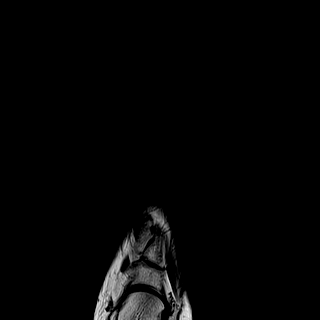
[im 27/27]
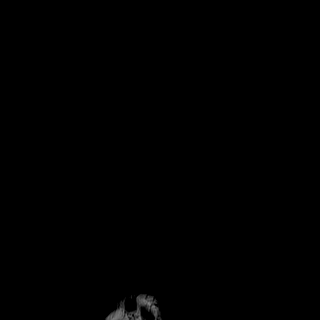

[Series 8: T2 fat-sat · sagittal · 3.0mm · 0.35mm/px · 6 of 26 slices shown (3 of 3)]
[im 1/26]
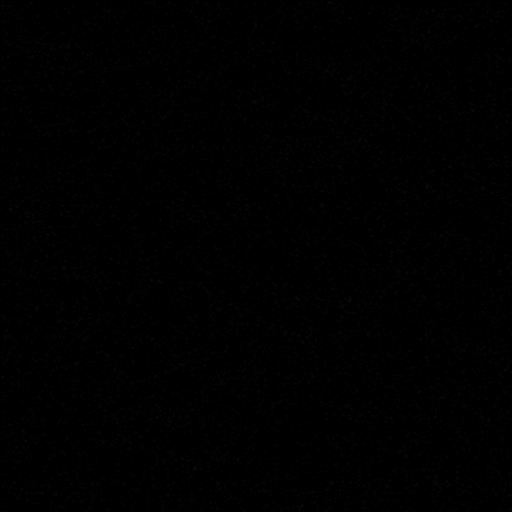
[im 6/26]
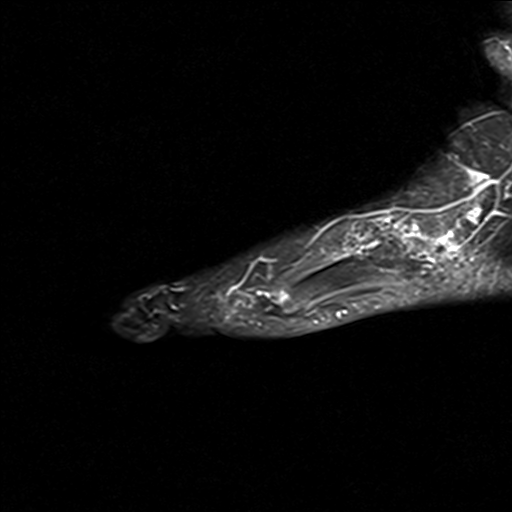
[im 11/26]
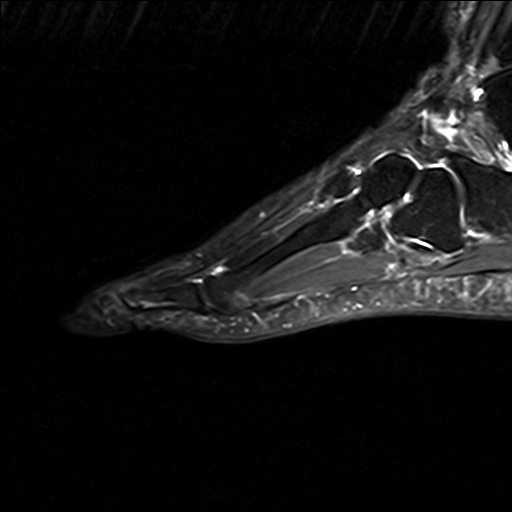
[im 16/26]
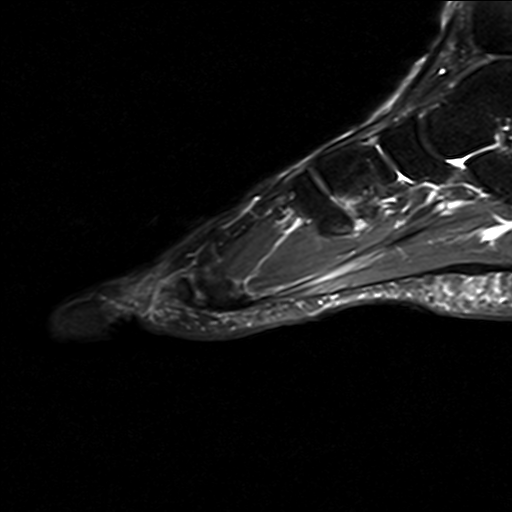
[im 21/26]
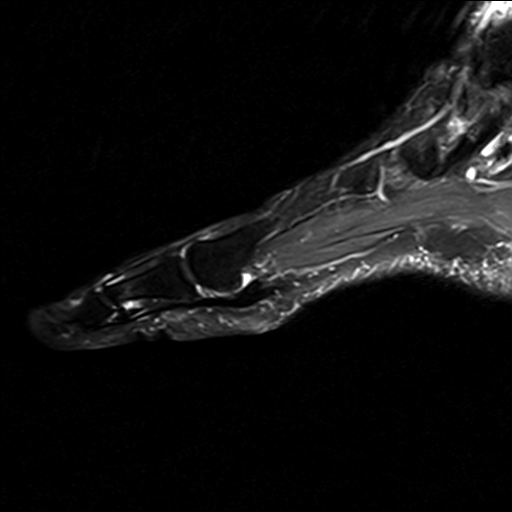
[im 26/26]
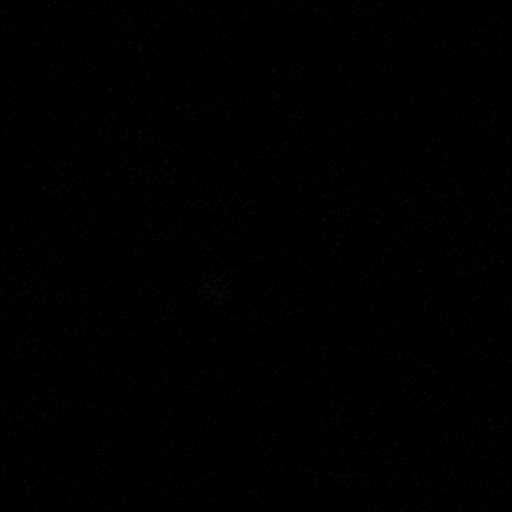

[40 of 40 positions shown; findings below may reference images not displayed]

FINDINGS: No significant bony findings. No stress fracture, marrow edema or
bone contusion. No arthropathic changes.

Mild edema like signal abnormality along the flexor tendons likely
mild tenosynovitis. No findings for myositis or muscle tear.

The proximal aspect of the plantar fascia is not imaged on this
examination. There is mild focal thickening of the plantar fascia in
the distal hindfoot which could be a plantar fibroma or plantar
fasciitis. This involves the flexor digitorum brevis component.
IMPRESSION: 1. Mild edema like signal abnormality along the flexor tendons of
the midfoot suggesting tenosynovitis.
2. Mild focal thickening of the plantar fascia in the distal
hindfoot which could be a plantar fibroma or plantar fasciitis. The
hindfoot was not included in this examination. If the this is an of
clinical concern patient could be re-scanned with sagittal and
coronal T2 weighted images with fat sat.
3. No significant bony findings.

## 2016-11-28 ENCOUNTER — Other Ambulatory Visit (INDEPENDENT_AMBULATORY_CARE_PROVIDER_SITE_OTHER): Payer: BLUE CROSS/BLUE SHIELD

## 2016-11-28 DIAGNOSIS — E785 Hyperlipidemia, unspecified: Secondary | ICD-10-CM | POA: Diagnosis not present

## 2016-11-28 DIAGNOSIS — E559 Vitamin D deficiency, unspecified: Secondary | ICD-10-CM | POA: Diagnosis not present

## 2016-11-28 DIAGNOSIS — R5383 Other fatigue: Secondary | ICD-10-CM | POA: Diagnosis not present

## 2016-11-28 LAB — LIPID PANEL
CHOL/HDL RATIO: 5
Cholesterol: 244 mg/dL — ABNORMAL HIGH (ref 0–200)
HDL: 51.7 mg/dL (ref >39.00)
LDL Cholesterol: 168 mg/dL — ABNORMAL HIGH (ref 0–99)
NONHDL: 192.03
Triglycerides: 122 mg/dL (ref 0.0–149.0)
VLDL: 24.4 mg/dL (ref 0.0–40.0)

## 2016-11-28 LAB — COMPREHENSIVE METABOLIC PANEL
ALK PHOS: 57 U/L (ref 39–117)
ALT: 11 U/L (ref 0–35)
AST: 14 U/L (ref 0–37)
Albumin: 4.5 g/dL (ref 3.5–5.2)
BILIRUBIN TOTAL: 0.6 mg/dL (ref 0.2–1.2)
BUN: 14 mg/dL (ref 6–23)
CO2: 31 meq/L (ref 19–32)
CREATININE: 0.96 mg/dL (ref 0.40–1.20)
Calcium: 9.6 mg/dL (ref 8.4–10.5)
Chloride: 104 mEq/L (ref 96–112)
GFR: 65.45 mL/min (ref >60.00)
GLUCOSE: 100 mg/dL — AB (ref 70–99)
Potassium: 4.3 mEq/L (ref 3.5–5.1)
SODIUM: 141 meq/L (ref 135–145)
TOTAL PROTEIN: 6.7 g/dL (ref 6.0–8.3)

## 2016-11-28 LAB — CBC WITH DIFFERENTIAL/PLATELET
Basophils Absolute: 0 10*3/uL (ref 0.0–0.1)
Basophils Relative: 0.7 % (ref 0.0–3.0)
EOS PCT: 2.2 % (ref 0.0–5.0)
Eosinophils Absolute: 0.1 10*3/uL (ref 0.0–0.7)
HCT: 40.9 % (ref 36.0–46.0)
HEMOGLOBIN: 14 g/dL (ref 12.0–15.0)
LYMPHS ABS: 2.5 10*3/uL (ref 0.7–4.0)
Lymphocytes Relative: 42.3 % (ref 12.0–46.0)
MCHC: 34.1 g/dL (ref 30.0–36.0)
MCV: 90.4 fl (ref 78.0–100.0)
MONO ABS: 0.4 10*3/uL (ref 0.1–1.0)
Monocytes Relative: 6.4 % (ref 3.0–12.0)
NEUTROS PCT: 48.4 % (ref 43.0–77.0)
Neutro Abs: 2.8 10*3/uL (ref 1.4–7.7)
Platelets: 205 10*3/uL (ref 150.0–400.0)
RBC: 4.53 Mil/uL (ref 3.87–5.11)
RDW: 12.5 % (ref 11.5–15.5)
WBC: 5.9 10*3/uL (ref 4.0–10.5)

## 2016-11-28 LAB — VITAMIN D 25 HYDROXY (VIT D DEFICIENCY, FRACTURES): VITD: 33.44 ng/mL (ref 30.00–100.00)

## 2016-11-28 LAB — TSH: TSH: 2.23 u[IU]/mL (ref 0.35–4.50)

## 2016-11-28 NOTE — Addendum Note (Signed)
Addended by: Penne LashWIGGINS, Teola Felipe N on: 11/28/2016 08:39 AM   Modules accepted: Orders

## 2016-12-02 ENCOUNTER — Encounter: Payer: Self-pay | Admitting: Internal Medicine

## 2016-12-07 ENCOUNTER — Ambulatory Visit (INDEPENDENT_AMBULATORY_CARE_PROVIDER_SITE_OTHER): Payer: BLUE CROSS/BLUE SHIELD | Admitting: Internal Medicine

## 2016-12-07 ENCOUNTER — Encounter: Payer: Self-pay | Admitting: Internal Medicine

## 2016-12-07 ENCOUNTER — Ambulatory Visit (INDEPENDENT_AMBULATORY_CARE_PROVIDER_SITE_OTHER): Payer: BLUE CROSS/BLUE SHIELD

## 2016-12-07 VITALS — BP 124/72 | HR 76 | Temp 98.4°F | Resp 15 | Ht 64.0 in | Wt 159.0 lb

## 2016-12-07 DIAGNOSIS — Z0000 Encounter for general adult medical examination without abnormal findings: Secondary | ICD-10-CM | POA: Diagnosis not present

## 2016-12-07 DIAGNOSIS — M546 Pain in thoracic spine: Secondary | ICD-10-CM | POA: Diagnosis not present

## 2016-12-07 DIAGNOSIS — Z23 Encounter for immunization: Secondary | ICD-10-CM | POA: Diagnosis not present

## 2016-12-07 DIAGNOSIS — G8929 Other chronic pain: Secondary | ICD-10-CM | POA: Diagnosis not present

## 2016-12-07 DIAGNOSIS — R140 Abdominal distension (gaseous): Secondary | ICD-10-CM

## 2016-12-07 DIAGNOSIS — R1013 Epigastric pain: Secondary | ICD-10-CM | POA: Diagnosis not present

## 2016-12-07 MED ORDER — ESOMEPRAZOLE MAGNESIUM 20 MG PO CPDR: 20.0000 mg | DELAYED_RELEASE_CAPSULE | Freq: Every day | ORAL | 5 refills | Status: DC

## 2016-12-07 MED ORDER — DICYCLOMINE HCL 20 MG PO TABS: 20.0000 mg | ORAL_TABLET | Freq: Three times a day (TID) | ORAL | 3 refills | Status: DC

## 2016-12-07 MED ORDER — SUMATRIPTAN 20 MG/ACT NA SOLN: 1.0000 | NASAL | 5 refills | Status: DC | PRN

## 2016-12-07 NOTE — Progress Notes (Signed)
Patient ID: Windy Cannymy R Moist, female    DOB: June 17, 1966  Age: 51 y.o. MRN: [id number]  The patient is here for annual preventive examination and management of other chronic and acute problems. Patient has been lost to follow up since November 2016  [facility].  Last PAP 2017? S/p partial hysterectomy.    Annual mammograms ordered by hime as well  Due for Ttdap   The risk factors are reflected in the social history.  The roster of all physicians providing medical care to patient - is listed in the Snapshot section of the chart.  Activities of daily living:  The patient is 100% independent in all ADLs: dressing, toileting, feeding as well as independent mobility  Home safety : The patient has smoke detectors in the home. They wear seatbelts.  There are no firearms at home. There is no violence in the home.   There is no risks for hepatitis, STDs or HIV. There is no   history of blood transfusion. They have no travel history to infectious disease endemic areas of the world.  The patient has seen their dentist in the last six month. They have seen their eye doctor in the last year.    They do not  have excessive sun exposure. Discussed the need for sun protection: hats, long sleeves and use of sunscreen if there is significant sun exposure.   Diet: the importance of a healthy diet is discussed. They do have a healthy diet.  The benefits of regular aerobic exercise were discussed. She is not exercising regularly.   Depression screen: there are no signs or vegative symptoms of depression- irritability, change in appetite, anhedonia, sadness/tearfullness.  Cognitive assessment: the patient manages all their financial and personal affairs and is actively engaged. They could relate day,date,year and events; recalled 2/3 objects at 3 minutes; performed clock-face test normally.  The following portions of the patient's history were reviewed and updated as  appropriate: allergies, current medications, past family history, past medical history,  past surgical history, past social history  and problem list.  Visual acuity was not assessed per patient preference since she has regular follow up with her ophthalmologist. Hearing and body mass index were assessed and reviewed.   During the course of the visit the patient was educated and counseled about appropriate screening and preventive services including : fall prevention , diabetes screening, nutrition counseling, colorectal cancer screening, and recommended immunizations.    CC: The primary encounter diagnosis was Thoracic spine pain. Diagnoses of Need for Tdap vaccination, Epigastric pain, Abdominal bloating, Chronic right-sided thoracic back pain, and Encounter for preventive health examination were also pertinent to this visit.  Last seen by me November 2016   Migraine headaches have been occurring more frequently , managed with nasal imitrex.  Refill needed.  Has missed 10 days of work in the past 6 months for migraines and for recurrent abdominal pain.  2)  Abdominal  pain:  Has been having recurrent  episodes of severe abdominal pain accompanied by increased frequency of stools , stools become progressively softer,  But not liquid.  some nausea but no vomiting.  Not related to eating,  Not involving the  RUQ.  Has occasional constipation managed with omeprazole.  Has been taking it daily since her ER visit in August 2017; diagnosed with  Mild chronic gastritis per biopsy of stomach done during EGD by Lynnae Prudeobert Elliott  In October,  H pylori negative via biopsy and  breat test.   Normal HIDA scan last October . Colonoscopy normal in 2015 so not repeated given absence of warning signs such as weight loss.  Patient does smoke marijuana,  Symptoms may be due to marijuana withdrawal.  Was told by GI to follow GERD dietary and behavioral routine,  Continue PPI   3) constipation : was given a maintenance regiemn  of miralax up to 2/daily daily probiotic.   4) isolated episodes of  Elevated blood pressure occurring with headache and flushing  5) Stye vs other growth , left lower eyelid   6) Right Side still hurts, ribs still bothering her and tender to the touch at times .      7) Snoring a lot. Per husband  Wakes up tired .  Can't fall asleep before midnight.      History Adonis has a past medical history of Allergic rhinitis; Genital herpes; Hiatal hernia; and Marijuana smoker.   She has a past surgical history that includes Tubal ligation; Abdominal hysterectomy; Tonsillectomy; Ankle surgery; and Ganglion cyst excision.   Her family history includes Breast cancer in her mother; COPD in her father; Coronary artery disease (age of onset: 30) in her father; Fibromyalgia in her sister; Hyperlipidemia in her mother; Lung cancer in her father; Other in her mother.She reports that she has never smoked. She has never used smokeless tobacco. She reports that she drinks alcohol. She reports that she uses drugs.  Outpatient Medications Prior to Visit  Medication Sig Dispense Refill  . acetaminophen (TYLENOL) 500 MG tablet Take 500 mg by mouth every 8 (eight) hours as needed.    . Cetirizine HCl 10 MG CAPS Take by mouth.    . Simethicone (GAS RELIEF 80 PO) Take 1 capsule by mouth daily.    . valACYclovir (VALTREX) 500 MG tablet Take 500 mg by mouth as needed.      Marland Kitchen esomeprazole (NEXIUM) 20 MG capsule Take 1 capsule (20 mg total) by mouth daily. 30 capsule 1  . Cholecalciferol (VITAMIN D-1000 MAX ST) 1000 units tablet Take by mouth.    . co-enzyme Q-10 30 MG capsule Take 60 mg by mouth daily.    . SUMAtriptan (IMITREX) 20 MG/ACT nasal spray Place 1 spray (20 mg total) into the nose every 2 (two) hours as needed for migraine. 1 Inhaler 5   No facility-administered medications prior to visit.     Review of Systems   Patient denies headache, fevers, malaise, unintentional weight loss, skin rash, eye pain,  sinus congestion and sinus pain, sore throat, dysphagia,  hemoptysis , cough, dyspnea, wheezing, chest pain, palpitations, orthopnea, edema,  melena, diarrhea, constipation, flank pain, dysuria, hematuria, urinary  Frequency, nocturia, numbness, tingling, seizures,  Focal weakness, Loss of consciousness,  Tremor, insomnia, depression, anxiety, and suicidal ideation.      Objective:  BP 124/72 (BP Location: Left Arm, Patient Position: Sitting, Cuff Size: Normal)   Pulse 76   Temp 98.4 F (36.9 C) (Oral)   Resp 15   Ht 5\' 4"  (1.626 m)   Wt 159 lb (72.1 kg)   SpO2 96%   BMI 27.29 kg/m   Physical Exam   General appearance: alert, cooperative and appears stated age Ears: normal TM's and external ear canals both ears Throat: lips, mucosa, and tongue normal; teeth and gums normal Neck: no adenopathy, no carotid bruit, supple, symmetrical, trachea midline and thyroid not enlarged, symmetric, no tenderness/mass/nodules Back: symmetric, no curvature. ROM normal. No CVA tenderness. Lungs: clear to auscultation bilaterally  Heart: regular rate and rhythm, S1, S2 normal, no murmur, click, rub or gallop Abdomen: soft, non-tender; bowel sounds normal; no masses,  no organomegaly Pulses: 2+ and symmetric Skin: Skin color, texture, turgor normal. No rashes or lesions Lymph nodes: Cervical, supraclavicular, and axillary nodes normal.    Assessment & Plan:   Problem List Items Addressed This Visit    Encounter for preventive health examination    Annual comprehensive preventive exam was done as well as an evaluation and management of chronic conditions .  During the course of the visit the patient was educated and counseled about appropriate screening and preventive services including :  diabetes screening, lipid analysis with projected  10 year  risk for CAD , nutrition counseling, breast, cervical and colorectal cancer screening, and recommended immunizations.  Printed recommendations for health  maintenance screenings was give      Chronic right-sided thoracic back pain    Her  thoracic spine films were done today to evaluate alignment and presence of any signs suggestive of DDD. alignement was normal and there were nochanges suggestive of DDD or herniated disk       Abdominal pain    Chronic, episodic,  Has had comprehensive workup including EGD with biopsies, colonoscopy (2015) and HID scan.  Ay be due to marijuana withdrawal vs IBS.  Continue daily PPR, regimen for constipation . Advised to consider stopping all use of marijuana        Abdominal bloating    likely diet related.  Discussed dietary , recreational habits.        Other Visit Diagnoses    Thoracic spine pain    -  Primary   Relevant Orders   DG Thoracic Spine W/Swimmers (Completed)   Need for Tdap vaccination       Relevant Orders   Tdap vaccine greater than or equal to 7yo IM (Completed)      I have discontinued Ms. Whittington's co-enzyme Q-10 and Cholecalciferol. I am also having her start on dicyclomine. Additionally, I am having her maintain her valACYclovir, acetaminophen, Simethicone (GAS RELIEF 80 PO), Cetirizine HCl, SUMAtriptan, and esomeprazole.  Meds ordered this encounter  Medications  . DISCONTD: esomeprazole (NEXIUM) 20 MG capsule    Sig: Take 1 capsule (20 mg total) by mouth daily.    Dispense:  30 capsule    Refill:  5  . dicyclomine (BENTYL) 20 MG tablet    Sig: Take 1 tablet (20 mg total) by mouth 4 (four) times daily -  before meals and at bedtime.    Dispense:  120 tablet    Refill:  3  . SUMAtriptan (IMITREX) 20 MG/ACT nasal spray    Sig: Place 1 spray (20 mg total) into the nose every 2 (two) hours as needed for migraine.    Dispense:  1 Inhaler    Refill:  5  . esomeprazole (NEXIUM) 20 MG capsule    Sig: Take 1 capsule (20 mg total) by mouth daily.    Dispense:  30 capsule    Refill:  5   In addition to the time I spent performing the CPE,  I spent an additional  15 minutes of  face to face time with patient more than half of which was spent in counseling patient on the above mentioned issues , reviewing and explaining recent labs and prior  imaging studies done, and coordination of care.  Medications Discontinued During This Encounter  Medication Reason  . Cholecalciferol (VITAMIN D-1000 MAX ST) 1000 units  tablet Patient has not taken in last 30 days  . co-enzyme Q-10 30 MG capsule Patient has not taken in last 30 days  . esomeprazole (NEXIUM) 20 MG capsule Reorder  . SUMAtriptan (IMITREX) 20 MG/ACT nasal spray Reorder  . esomeprazole (NEXIUM) 20 MG capsule Reorder    Follow-up: No Follow-up on file.   Sherlene Shams, MD

## 2016-12-07 NOTE — Patient Instructions (Addendum)
The  diet I discussed with you today is the 10 day Green Smoothie Cleansing /Detox Diet by Brooke DareJJ Smith . available on Amazon for around $10.  It does require a blender, (Vita Mix, a electric juicer,  Or a Nutribullet Rx).  This is not a low carb or a weight loss diet,  It is fundamentally a "cleansing" low fat diet that eliminates sugar, gluten, caffeine, alcohol and dairy for 10 days .  What you add back after the initial ten days is entirely up to  you!  You can expect to lose 5 to 10 lbs depending on how strict you are.   I found that  drinking 2 smoothies or juices  daily and keeping one chewable meal (but keep it simple, like baked fish and salad, rice or bok choy) kept me satisfied and kept me from straying  .  You snack primarily on fresh  fruit, egg whites and judicious quantities of nuts.  You can add a  vegetable based protein powder  to any smoothie made with almond milk (nothing with whey , since whey is dairy)  [employer] has a few but  the [employer] has the greatest  selection .  Using frozen fruits is much more convenient and cost effective. You can even find plenty of organic fruit in the frozen fruit section of [employer].  Just thaw what you need for the following day the night before in the refrigerator (to avoid jamming up your machine)    Here's a great low carb popsicle recipe:  1 cup frozen dark cherries  1 cup  unsweetened almond milk  1 cup vanilla Oikos Triple  zero or Dannon Lt nf Fit  [place] yougurt   I would take a B12 , Vitamin D (1000 IU) ,  Calcium 600 mg daily .    You received your TdaP ( vaccine) today . It is good for ten years   Tiral of dicyclomine , an antispasmodic that helps Irritable Bowel syndrome.  Take if up to 4 x daily BEFORE MEALS   Diet for Irritable Bowel Syndrome When you have irritable bowel syndrome (IBS), the foods you eat and your eating habits are very important. IBS may cause various symptoms, such as abdominal pain, constipation, or  diarrhea. Choosing the right foods can help ease discomfort caused by these symptoms. Work with your health care provider and dietitian to find the best eating plan to help control your symptoms. What general guidelines do I need to follow?  Keep a food diary. This will help you identify foods that cause symptoms. Write down: ? What you eat and when. ? What symptoms you have. ? When symptoms occur in relation to your meals.  Avoid foods that cause symptoms. Talk with your dietitian about other ways to get the same nutrients that are in these foods.  Eat more foods that contain fiber. Take a fiber supplement if directed by your dietitian.  Eat your meals slowly, in a relaxed setting.  Aim to eat 5-6 small meals per day. Do not skip meals.  Drink enough fluids to keep your urine clear or pale yellow.  Ask your health care provider if you should take an over-the-counter probiotic during flare-ups to help restore healthy gut bacteria.  If you have cramping or diarrhea, try making your meals low in fat and high in carbohydrates. Examples of carbohydrates are pasta, rice, whole grain breads and cereals, fruits, and vegetables.  If dairy products cause your symptoms to flare  up, try eating less of them. You might be able to handle yogurt better than other dairy products because it contains bacteria that help with digestion. What foods are not recommended? The following are some foods and drinks that may worsen your symptoms:  Fatty foods, such as [place] fries.  Milk products, such as cheese or ice cream.  Chocolate.  Alcohol.  Products with caffeine, such as coffee.  Carbonated drinks, such as soda.  The items listed above may not be a complete list of foods and beverages to avoid. Contact your dietitian for more information. What foods are good sources of fiber? Your health care provider or dietitian may recommend that you eat more foods that contain fiber. Fiber can help reduce  constipation and other IBS symptoms. Add foods with fiber to your diet a little at a time so that your body can get used to them. Too much fiber at once might cause gas and swelling of your abdomen. The following are some foods that are good sources of fiber:  Apples.  Peaches.  Pears.  Berries.  Figs.  Broccoli (raw).  Cabbage.  Carrots.  Raw peas.  Kidney beans.  Lima beans.  Whole grain bread.  Whole grain cereal.  Where to find more information: [employer] for Functional Gastrointestinal Disorders: www.iffgd.[employer] of Diabetes and Digestive and Kidney Diseases: http://norris-lawson.com/.aspx This information is not intended to replace advice given to you by your health care provider. Make sure you discuss any questions you have with your health care provider. Document Released: 09/08/2003 Document Revised: 11/24/2015 Document Reviewed: 09/18/2013 Elsevier Interactive Patient Education  2018 [employer].

## 2016-12-09 ENCOUNTER — Encounter: Payer: Self-pay | Admitting: Internal Medicine

## 2016-12-09 DIAGNOSIS — G8929 Other chronic pain: Secondary | ICD-10-CM | POA: Insufficient documentation

## 2016-12-09 DIAGNOSIS — M546 Pain in thoracic spine: Secondary | ICD-10-CM

## 2016-12-09 NOTE — Assessment & Plan Note (Signed)
Annual comprehensive preventive exam was done as well as an evaluation and management of chronic conditions .  During the course of the visit the patient was educated and counseled about appropriate screening and preventive services including :  diabetes screening, lipid analysis with projected  10 year  risk for CAD , nutrition counseling, breast, cervical and colorectal cancer screening, and recommended immunizations.  Printed recommendations for health maintenance screenings was give 

## 2016-12-09 NOTE — Assessment & Plan Note (Addendum)
Her  thoracic spine films were done today to evaluate alignment and presence of any signs suggestive of DDD. alignement was normal and there were nochanges suggestive of DDD or herniated disk

## 2016-12-09 NOTE — Assessment & Plan Note (Signed)
likely diet related.  Discussed dietary , recreational habits.

## 2016-12-09 NOTE — Assessment & Plan Note (Signed)
Chronic, episodic,  Has had comprehensive workup including EGD with biopsies, colonoscopy (2015) and HID scan.  Ay be due to marijuana withdrawal vs IBS.  Continue daily PPR, regimen for constipation . Advised to consider stopping all use of marijuana

## 2016-12-26 DIAGNOSIS — M4005 Postural kyphosis, thoracolumbar region: Secondary | ICD-10-CM | POA: Diagnosis not present

## 2016-12-26 DIAGNOSIS — M9903 Segmental and somatic dysfunction of lumbar region: Secondary | ICD-10-CM | POA: Diagnosis not present

## 2016-12-26 DIAGNOSIS — M9902 Segmental and somatic dysfunction of thoracic region: Secondary | ICD-10-CM | POA: Diagnosis not present

## 2016-12-28 DIAGNOSIS — M9902 Segmental and somatic dysfunction of thoracic region: Secondary | ICD-10-CM | POA: Diagnosis not present

## 2016-12-28 DIAGNOSIS — M9903 Segmental and somatic dysfunction of lumbar region: Secondary | ICD-10-CM | POA: Diagnosis not present

## 2016-12-28 DIAGNOSIS — M4005 Postural kyphosis, thoracolumbar region: Secondary | ICD-10-CM | POA: Diagnosis not present

## 2016-12-28 DIAGNOSIS — G43001 Migraine without aura, not intractable, with status migrainosus: Secondary | ICD-10-CM | POA: Diagnosis not present

## 2017-01-01 DIAGNOSIS — M5386 Other specified dorsopathies, lumbar region: Secondary | ICD-10-CM | POA: Diagnosis not present

## 2017-01-01 DIAGNOSIS — M530 Cervicocranial syndrome: Secondary | ICD-10-CM | POA: Diagnosis not present

## 2017-01-08 DIAGNOSIS — M9901 Segmental and somatic dysfunction of cervical region: Secondary | ICD-10-CM | POA: Diagnosis not present

## 2017-01-08 DIAGNOSIS — M530 Cervicocranial syndrome: Secondary | ICD-10-CM | POA: Diagnosis not present

## 2017-01-08 DIAGNOSIS — M9902 Segmental and somatic dysfunction of thoracic region: Secondary | ICD-10-CM | POA: Diagnosis not present

## 2017-01-08 DIAGNOSIS — M5386 Other specified dorsopathies, lumbar region: Secondary | ICD-10-CM | POA: Diagnosis not present

## 2017-01-11 DIAGNOSIS — M9901 Segmental and somatic dysfunction of cervical region: Secondary | ICD-10-CM | POA: Diagnosis not present

## 2017-01-11 DIAGNOSIS — M9902 Segmental and somatic dysfunction of thoracic region: Secondary | ICD-10-CM | POA: Diagnosis not present

## 2017-01-11 DIAGNOSIS — M5386 Other specified dorsopathies, lumbar region: Secondary | ICD-10-CM | POA: Diagnosis not present

## 2017-01-11 DIAGNOSIS — M530 Cervicocranial syndrome: Secondary | ICD-10-CM | POA: Diagnosis not present

## 2017-01-21 ENCOUNTER — Ambulatory Visit (INDEPENDENT_AMBULATORY_CARE_PROVIDER_SITE_OTHER): Payer: BLUE CROSS/BLUE SHIELD | Admitting: Podiatry

## 2017-01-21 ENCOUNTER — Encounter: Payer: Self-pay | Admitting: Podiatry

## 2017-01-21 DIAGNOSIS — M779 Enthesopathy, unspecified: Secondary | ICD-10-CM | POA: Diagnosis not present

## 2017-01-21 DIAGNOSIS — M722 Plantar fascial fibromatosis: Secondary | ICD-10-CM | POA: Diagnosis not present

## 2017-01-21 NOTE — Progress Notes (Signed)
She presents today chief complaint of pain to the first metatarsophalangeal joint of the left foot. She states that injection worked for a long time. She is referring to injection from January. She's also complaining of bilateral heel pain. She has a history of plantar fasciitis.  Objective: Vital signs are stable alert and oriented 3 pulses are palpable. He has pain on palpation and in range of motion of the first metatarsophalangeal joint of the left foot. Otherwise she has pain on palpation medial calcaneal tubercles bilaterally.  Assessment: Pain and limp secondary to plantar fasciitis bilateral. Pain in limb secondary to capsulitis and hallux limitus.  Plan: Injected the first metatarsophalangeal joint today with dexamethasone and local anesthetic. After sterile Betadine skin prep. I also injected the heel similarly with Kenalog and local anesthetic. Follow-up with her as needed.

## 2017-03-05 ENCOUNTER — Telehealth: Payer: Self-pay | Admitting: *Deleted

## 2017-03-05 NOTE — Telephone Encounter (Signed)
Patient spoke with [facility] and they told her to go to ED. Patient stated that she can not go to the ED right now. She will try to go later this afternoon to be evaluated.

## 2017-03-05 NOTE — Telephone Encounter (Signed)
Advised patient that these symptoms could indicate a heart attack and she should be evaluated ASAP but patient stated she could not go at this time.

## 2017-03-05 NOTE — Telephone Encounter (Signed)
Patient is currently having right chest pain. No other symptom .  This pain has been going on off and on for 2 months *Pt transferred to Nurse Line  Pt contact [phone]

## 2017-03-05 NOTE — Telephone Encounter (Signed)
Patient Name: Kelly Yang DOB: December 10, 1965 Initial Comment Caller states she is experiencing chest pain. Nurse Assessment Nurse: Yetta BarreJones, RN, Miranda Date/Time (Eastern Time): 03/05/2017 2:53:52 PM Confirm and document reason for call. If symptomatic, describe symptoms. ---Caller states she has been having chest pain off and on for a couple of months. Does the patient have any new or worsening symptoms? ---Yes Will a triage be completed? ---Yes Related visit to physician within the last 2 weeks? ---No Does the PT have any chronic conditions? (i.e. diabetes, asthma, etc.) ---Yes List chronic conditions. ---GERD Is the patient pregnant or possibly pregnant? (Ask all females between the ages of 1712-55) ---No Is this a behavioral health or substance abuse call? ---No Guidelines Guideline Title Affirmed Question Affirmed Notes Chest Pain Pain also present in shoulder(s) or arm(s) or jaw (Exception: pain is clearly made worse by movement) Final Disposition User Go to ED Now Yetta BarreJones, RN, Miranda Comments RN is unsure that pt will go to the ED. Reinforced that these symptoms can be signs of impending heart attack and need to be evaluated immediately. Referrals GO TO FACILITY UNDECIDED Disagree/Comply: Comply

## 2017-03-05 NOTE — Telephone Encounter (Signed)
fyi

## 2017-03-06 ENCOUNTER — Emergency Department
Admission: EM | Admit: 2017-03-06 | Discharge: 2017-03-06 | Disposition: A | Discharge status: Home / Self Care | Payer: BLUE CROSS/BLUE SHIELD | Attending: Emergency Medicine | Admitting: Emergency Medicine

## 2017-03-06 ENCOUNTER — Encounter: Payer: Self-pay | Admitting: *Deleted

## 2017-03-06 ENCOUNTER — Emergency Department: Payer: BLUE CROSS/BLUE SHIELD

## 2017-03-06 DIAGNOSIS — Z79899 Other long term (current) drug therapy: Secondary | ICD-10-CM | POA: Insufficient documentation

## 2017-03-06 DIAGNOSIS — R0789 Other chest pain: Secondary | ICD-10-CM | POA: Insufficient documentation

## 2017-03-06 DIAGNOSIS — G43009 Migraine without aura, not intractable, without status migrainosus: Secondary | ICD-10-CM

## 2017-03-06 DIAGNOSIS — R079 Chest pain, unspecified: Secondary | ICD-10-CM | POA: Diagnosis not present

## 2017-03-06 LAB — BASIC METABOLIC PANEL
ANION GAP: 9 (ref 5–15)
BUN: 11 mg/dL (ref 6–20)
CALCIUM: 9.8 mg/dL (ref 8.9–10.3)
CO2: 27 mmol/L (ref 22–32)
Chloride: 103 mmol/L (ref 101–111)
Creatinine, Ser: 0.93 mg/dL (ref 0.44–1.00)
GFR calc non Af Amer: >60 mL/min (ref >60)
GLUCOSE: 105 mg/dL — AB (ref 65–99)
POTASSIUM: 3.8 mmol/L (ref 3.5–5.1)
Sodium: 139 mmol/L (ref 135–145)

## 2017-03-06 LAB — TROPONIN I

## 2017-03-06 LAB — CBC
HEMATOCRIT: 42.1 % (ref 35.0–47.0)
HEMOGLOBIN: 14.9 g/dL (ref 12.0–16.0)
MCH: 31.5 pg (ref 26.0–34.0)
MCHC: 35.5 g/dL (ref 32.0–36.0)
MCV: 88.8 fL (ref 80.0–100.0)
Platelets: 194 10*3/uL (ref 150–440)
RBC: 4.74 MIL/uL (ref 3.80–5.20)
RDW: 13.1 % (ref 11.5–14.5)
WBC: 6.9 10*3/uL (ref 3.6–11.0)

## 2017-03-06 MED ORDER — KETOROLAC TROMETHAMINE 30 MG/ML IJ SOLN
15.0000 mg | Freq: Once | INTRAMUSCULAR | Status: AC
  Administered 2017-03-06: 15 mg via INTRAVENOUS
  Filled 2017-03-06: qty 1

## 2017-03-06 MED ORDER — SODIUM CHLORIDE 0.9 % IV BOLUS (SEPSIS)
1000.0000 mL | Freq: Once | INTRAVENOUS | Status: AC
  Administered 2017-03-06: 1000 mL via INTRAVENOUS

## 2017-03-06 MED ORDER — METOCLOPRAMIDE HCL 5 MG/ML IJ SOLN
10.0000 mg | Freq: Once | INTRAMUSCULAR | Status: AC
  Administered 2017-03-06: 10 mg via INTRAVENOUS
  Filled 2017-03-06: qty 2

## 2017-03-06 NOTE — ED Notes (Addendum)
Pt reports that she called her doctor today because she has been having chest pain for the past 2 months 1-2 times a day - pt reports that the chest pain feels like a pulled muscle and is worse with pressure applied to the area and movement - denies chest pain at this time - pt reports she does have a headache (migraine all day) - pt took sumatriptan nasal spray at 11am

## 2017-03-06 NOTE — ED Provider Notes (Signed)
[facility] Emergency Department Provider Note ____________________________________________   First MD Initiated Contact with Patient 03/06/17 1347     (approximate)  I have reviewed the triage vital signs and the nursing notes.   HISTORY  Chief Complaint Chest Pain and Headache    HPI Kelly Yang is a 51 y.o. female presents with chest pain intermittently for a few months, occurring several times per day, lasting for a few minutes at a time, mainly in the right side of her chest and radiating to the right shoulder. It is somewhat positional, but not related to exertion. There is no associated shortness of breath, palpitations, lightheadedness, or nausea. He states she called her primary care doctor who instructed her to come to the ED to be evaluated. Patient also reports migraine type headache since this morning; she states it is identical to her prior migraines. The headache is right-sided, retro-orbital, throbbing, and associated with nausea.   Past Medical History:  Diagnosis Date  . Allergic rhinitis    rhinitis  . Genital herpes   . Hiatal hernia    EGD @ 18  . Marijuana smoker     Patient Active Problem List   Diagnosis Date Noted  . Chronic right-sided thoracic back pain 12/09/2016  . Encounter for preventive health examination 05/27/2015  . S/p partial hysterectomy with remaining cervical stump 05/25/2015  . Constipation 05/25/2015  . Dizziness 03/06/2013  . Seasonal allergies 03/06/2013  . Sciatica of left side 02/04/2013  . Depression, major, recurrent, moderate (HCC) 07/07/2012  . Functional dyspepsia 05/03/2011  . Abdominal pain 04/10/2011  . Abdominal bloating 04/10/2011  . Early satiety 04/10/2011    Past Surgical History:  Procedure Laterality Date  . ABDOMINAL HYSTERECTOMY     partical  . ANKLE SURGERY     bilateral spurs  . GANGLION CYST EXCISION     right wrist  . TONSILLECTOMY    . TUBAL LIGATION      Prior to  Admission medications   Medication Sig Start Date End Date Taking? Authorizing Provider  Cetirizine HCl 10 MG CAPS Take by mouth.   Yes [provider]  esomeprazole (NEXIUM) 20 MG capsule Take 1 capsule (20 mg total) by mouth daily. 12/07/16 12/07/17 Yes Sherlene Shams, MD  Multiple Vitamin (MULTIVITAMIN) tablet Take 1 tablet by mouth daily.   Yes [provider]  SUMAtriptan (IMITREX) 20 MG/ACT nasal spray Place 1 spray (20 mg total) into the nose every 2 (two) hours as needed for migraine. 12/07/16 12/07/17 Yes Sherlene Shams, MD  acetaminophen (TYLENOL) 500 MG tablet Take 500 mg by mouth every 8 (eight) hours as needed.    [provider]  dicyclomine (BENTYL) 20 MG tablet Take 1 tablet (20 mg total) by mouth 4 (four) times daily -  before meals and at bedtime. Patient not taking: Reported on 03/06/2017 12/07/16   Sherlene Shams, MD  Simethicone (GAS RELIEF 80 PO) Take 1 capsule by mouth daily.    [provider]  valACYclovir (VALTREX) 500 MG tablet Take 500 mg by mouth as needed.      [provider]    Allergies Codeine  Family History  Problem Relation Age of Onset  . Hyperlipidemia Mother   . Breast cancer Mother   . Other Mother        brain blockage-stent placement  . Lung cancer Father   . COPD Father   . Coronary artery disease Father 85  had CABG   . Fibromyalgia Sister     Social History Social History  Substance Use Topics  . Smoking status: Never Smoker  . Smokeless tobacco: Never Used     Comment: Uses marijuana,daily use for over 15 years , still uses regularly but not daily  . Alcohol use Yes     Comment: occasional    Review of Systems  Constitutional: No fever/chills Eyes: No visual changes. ENT: No sore throat. Cardiovascular: Positive for chest pain Respiratory: Denies shortness of breath. Gastrointestinal: Positive for nausea, no vomiting.  No diarrhea.  Genitourinary: Negative for dysuria.    Musculoskeletal: Negative for back pain. Skin: Negative for rash. Neurological: Positive for headaches, negative for focal weakness or numbness.   ____________________________________________   PHYSICAL EXAM:  VITAL SIGNS: ED Triage Vitals [03/06/17 1255]  Enc Vitals Group     BP (!) 149/77     Pulse Rate 91     Resp 16     Temp 98.4 F (36.9 C)     Temp Source Oral     SpO2 100 %     Weight 155 lb (70.3 kg)     Height 5\' 4"  (1.626 m)     Head Circumference      Peak Flow      Pain Score 4     Pain Loc      Pain Edu?      Excl. in GC?     Constitutional: Alert and oriented. Well appearing and in no acute distress. Eyes: Conjunctivae are normal. PERRLA. EOMI. Head: Atraumatic. Nose: No congestion/rhinnorhea. Mouth/Throat: Mucous membranes are moist.   Neck: Normal range of motion. No meningeal signs. Cardiovascular: Normal rate, regular rhythm. Grossly normal heart sounds.  Good peripheral circulation. Respiratory: Normal respiratory effort.  No retractions. Lungs CTAB. Gastrointestinal: Soft and nontender. No distention.  Genitourinary: No CVA tenderness. Musculoskeletal: No lower extremity edema.  Extremities warm and well perfused.  Neurologic:  Normal speech and language. No gross focal neurologic deficits are appreciated.  Skin:  Skin is warm and dry. No rash noted. Psychiatric: Mood and affect are normal. Speech and behavior are normal.  ____________________________________________   LABS (all labs ordered are listed, but only abnormal results are displayed)  Labs Reviewed  BASIC METABOLIC PANEL - Abnormal; Notable for the following:       Result Value   Glucose, Bld 105 (*)    All other components within normal limits  CBC  TROPONIN I   ____________________________________________  EKG  ED ECG REPORT I, Dionne BucySebastian Paden Senger, the attending physician, personally viewed and interpreted this ECG.  Date: 03/06/2017 EKG Time: 1248 Rate: 69 Rhythm:  normal sinus rhythm QRS Axis: normal Intervals: normal ST/T Wave abnormalities: ? Q wave V1, no acute abnormalities Narrative Interpretation: no evidence of acute ischemia  ____________________________________________  RADIOLOGY  Chest x-ray negative for acute findings.  ____________________________________________   PROCEDURES  Procedure(s) performed: No    Critical Care performed: No ____________________________________________   INITIAL IMPRESSION / ASSESSMENT AND PLAN / ED COURSE  Pertinent labs & imaging results that were available during my care of the patient were reviewed by me and considered in my medical decision making (see chart for details).  51 year old female, no prior cardiac history or risk factors presents with atypical chest pain for a few months occurring intermittently and with no concerning associated symptoms. She also reports headache since this morning which is identical to her prior migraines. On exam, patient is well-appearing vitals are normal except  for borderline blood pressure, and exam is otherwise unremarkable. Overall suspect most likely musculoskeletal versus nerve related pain or other benign cause, given chronic intermittent course and atypical qualities with no risk factors for ACS.  EKG with no ischemic findings.  Low HEART score if neg trop x1.  PE risk factors or clinical evidence for PE or DVT on exam. No evidence of aortic etiology.  Headache is consistent with migraine.  Plan: chest x-ray, basic labs, troponin 1, and treatment for patient's migraine.    ----------------------------------------- 3:30 PM on 03/06/2017 -----------------------------------------  Patient states that she feels much better and her headache is resolved. Cardiac workup including troponin and chest x-ray are negative. Patient will follow up with her primary care doctor for further cardiac workup as needed. Return precautions given. Patient also requests referral  to a neurologist for her migraines.   ____________________________________________   FINAL CLINICAL IMPRESSION(S) / ED DIAGNOSES  Final diagnoses:  Atypical chest pain  Migraine without aura and without status migrainosus, not intractable      NEW MEDICATIONS STARTED DURING THIS VISIT:  Discharge Medication List as of 03/06/2017  3:32 PM       Note:  This document was prepared using Dragon voice recognition software and may include unintentional dictation errors.    Dionne Bucy, MD 03/06/17 2102

## 2017-03-06 NOTE — ED Triage Notes (Signed)
States mid chest pain to her right shoulder for 2 months, states migraine as well with hx of same, awake and alert in no acute distress

## 2017-03-06 NOTE — Discharge Instructions (Signed)
Return to the ER for new, worsening, or constant chest pain, difficulty breathing, weakness, or any other new or worsening symptoms that concern you.

## 2017-03-06 NOTE — ED Notes (Signed)
Pt signed paper copy of discharge 

## 2017-03-19 DIAGNOSIS — M79642 Pain in left hand: Secondary | ICD-10-CM | POA: Diagnosis not present

## 2017-05-20 ENCOUNTER — Encounter: Payer: Self-pay | Admitting: Podiatry

## 2017-05-20 ENCOUNTER — Ambulatory Visit (INDEPENDENT_AMBULATORY_CARE_PROVIDER_SITE_OTHER): Payer: BLUE CROSS/BLUE SHIELD | Admitting: Podiatry

## 2017-05-20 DIAGNOSIS — M779 Enthesopathy, unspecified: Secondary | ICD-10-CM

## 2017-05-20 DIAGNOSIS — M7752 Other enthesopathy of left foot: Secondary | ICD-10-CM

## 2017-05-20 DIAGNOSIS — M778 Other enthesopathies, not elsewhere classified: Secondary | ICD-10-CM

## 2017-05-20 NOTE — Progress Notes (Signed)
She presents today states that still tender sure first to the hallux limitus first metatarsal phalangeal joint left foot.  Objective: Vital signs are stable alert and oriented 3. Pulses are palpable. She has pain on an range of motion of the first metatarsal joint left foot. Tenderness on palpation overlying the dorsal lateral aspect of the joint.  Assessment: Severe to be a functional hallux limitus left.  Plan: Injected dexamethasone Kenalog and local anesthetic to the point of maximal tenderness of the left first metatarsophalangeal joint. Follow-up with her as needed.

## 2017-05-28 DIAGNOSIS — M79642 Pain in left hand: Secondary | ICD-10-CM | POA: Diagnosis not present

## 2017-06-05 DIAGNOSIS — G43019 Migraine without aura, intractable, without status migrainosus: Secondary | ICD-10-CM | POA: Diagnosis not present

## 2017-06-05 DIAGNOSIS — Z049 Encounter for examination and observation for unspecified reason: Secondary | ICD-10-CM | POA: Diagnosis not present

## 2017-06-05 DIAGNOSIS — Z79899 Other long term (current) drug therapy: Secondary | ICD-10-CM | POA: Diagnosis not present

## 2017-06-05 DIAGNOSIS — R51 Headache: Secondary | ICD-10-CM | POA: Diagnosis not present

## 2017-07-15 DIAGNOSIS — L821 Other seborrheic keratosis: Secondary | ICD-10-CM | POA: Diagnosis not present

## 2017-09-03 DIAGNOSIS — M79641 Pain in right hand: Secondary | ICD-10-CM | POA: Diagnosis not present

## 2017-09-04 ENCOUNTER — Ambulatory Visit (INDEPENDENT_AMBULATORY_CARE_PROVIDER_SITE_OTHER): Payer: BLUE CROSS/BLUE SHIELD | Admitting: Obstetrics & Gynecology

## 2017-09-04 ENCOUNTER — Encounter: Payer: Self-pay | Admitting: *Deleted

## 2017-09-04 ENCOUNTER — Encounter: Payer: Self-pay | Admitting: Obstetrics & Gynecology

## 2017-09-04 VITALS — BP 120/80 | Ht 63.5 in | Wt 164.0 lb

## 2017-09-04 DIAGNOSIS — Z1211 Encounter for screening for malignant neoplasm of colon: Secondary | ICD-10-CM

## 2017-09-04 DIAGNOSIS — E7800 Pure hypercholesterolemia, unspecified: Secondary | ICD-10-CM

## 2017-09-04 DIAGNOSIS — N959 Unspecified menopausal and perimenopausal disorder: Secondary | ICD-10-CM | POA: Diagnosis not present

## 2017-09-04 DIAGNOSIS — Z0000 Encounter for general adult medical examination without abnormal findings: Secondary | ICD-10-CM | POA: Diagnosis not present

## 2017-09-04 DIAGNOSIS — Z1239 Encounter for other screening for malignant neoplasm of breast: Secondary | ICD-10-CM

## 2017-09-04 DIAGNOSIS — Z1231 Encounter for screening mammogram for malignant neoplasm of breast: Secondary | ICD-10-CM | POA: Diagnosis not present

## 2017-09-04 DIAGNOSIS — Z124 Encounter for screening for malignant neoplasm of cervix: Secondary | ICD-10-CM

## 2017-09-04 MED ORDER — VALACYCLOVIR HCL 500 MG PO TABS: 500.0000 mg | ORAL_TABLET | ORAL | 6 refills | Status: DC | PRN

## 2017-09-04 MED ORDER — ESTRADIOL 0.075 MG/24HR TD PTWK: 0.0750 mg | MEDICATED_PATCH | TRANSDERMAL | 11 refills | Status: DC

## 2017-09-04 NOTE — Patient Instructions (Signed)
PAP every three years Mammogram every year    Call [phone] [facility] 10 [facility] Labs yearly  Estradiol skin patches What is this medicine? ESTRADIOL (es tra DYE ole) skin patches contain an estrogen. It is mostly used as hormone replacement in menopausal women. It helps to treat hot flashes and prevent osteoporosis. It is also used to treat women with low estrogen levels or those who have had their ovaries removed. This medicine may be used for other purposes; ask your health care provider or pharmacist if you have questions. COMMON BRAND NAME(S): Alora, Climara, Esclim, Estraderm, FemPatch, Menostar, Minivelle, Vivelle, Vivelle-Dot What should I tell my health care provider before I take this medicine? They need to know if you have any of these conditions: -abnormal vaginal bleeding -blood vessel disease or blood clots -breast, cervical, endometrial, ovarian, liver, or uterine cancer -dementia -diabetes -gallbladder disease -heart disease or recent heart attack -high blood pressure -high cholesterol -high level of calcium in the blood -hysterectomy -kidney disease -liver disease -migraine headaches -protein C deficiency -protein S deficiency -stroke -systemic lupus erythematosus (SLE) -tobacco smoker -an unusual or allergic reaction to estrogens, other hormones, medicines, foods, dyes, or preservatives -pregnant or trying to get pregnant -breast-feeding How should I use this medicine? This medicine is for external use only. Follow the directions on the prescription label. Tear open the pouch, do not use scissors. Remove the stiff protective liner covering the adhesive. Try not to touch the adhesive. Apply the patch, sticky side to the skin, to an area that is clean, dry and hairless. Avoid injured, irritated, calloused, or scarred areas. Do not apply the skin patches to your breasts or around the waistline. Use a different site each time to  prevent skin irritation. Do not cut or trim the patch. Do not stop using except on the advice of your doctor or health care professional. Do not wear more than one patch at a time unless you are told to do so by your doctor or health care professional. Contact your pediatrician regarding the use of this medicine in children. Special care may be needed. A patient package insert for the product will be given with each prescription and refill. Read this sheet carefully each time. The sheet may change frequently. Overdosage: If you think you have taken too much of this medicine contact a poison control center or emergency room at once. NOTE: This medicine is only for you. Do not share this medicine with others. What if I miss a dose? If you miss a dose, apply it as soon as you can. If it is almost time for your next dose, apply only that dose. Do not apply double or extra doses. What may interact with this medicine? Do not take this medicine with any of the following medications: -aromatase inhibitors like aminoglutethimide, anastrozole, exemestane, letrozole, testolactone This medicine may also interact with the following medications: -carbamazepine -certain antibiotics used to treat infections -certain barbiturates used for inducing sleep or treating seizures -grapefruit juice -medicines for fungus infections like itraconazole and ketoconazole -raloxifene or tamoxifen -rifabutin, rifampin, or rifapentine -ritonavir -St. John's Wort This list may not describe all possible interactions. Give your health care provider a list of all the medicines, herbs, non-prescription drugs, or dietary supplements you use. Also tell them if you smoke, drink alcohol, or use illegal drugs. Some items may interact with your medicine. What should I watch for while using this medicine? Visit your doctor or health care professional for regular  checks on your progress. You will need a regular breast and pelvic exam and  Pap smear while on this medicine. You should also discuss the need for regular mammograms with your health care professional, and follow his or her guidelines for these tests. This medicine can make your body retain fluid, making your fingers, hands, or ankles swell. Your blood pressure can go up. Contact your doctor or health care professional if you feel you are retaining fluid. If you have any reason to think you are pregnant, stop taking this medicine right away and contact your doctor or health care professional. Smoking increases the risk of getting a blood clot or having a stroke while you are taking this medicine, especially if you are more than 51 years old. You are strongly advised not to smoke. If you wear contact lenses and notice visual changes, or if the lenses begin to feel uncomfortable, consult your eye doctor or health care professional. This medicine can increase the risk of developing a condition (endometrial hyperplasia) that may lead to cancer of the lining of the uterus. Taking progestins, another hormone drug, with this medicine lowers the risk of developing this condition. Therefore, if your uterus has not been removed (by a hysterectomy), your doctor may prescribe a progestin for you to take together with your estrogen. You should know, however, that taking estrogens with progestins may have additional health risks. You should discuss the use of estrogens and progestins with your health care professional to determine the benefits and risks for you. If you are going to have surgery or an MRI, you may need to stop taking this medicine. Consult your health care professional for advice before you schedule the surgery. Contact with water while you are swimming, using a sauna, bathing, or showering may cause the patch to fall off. If your patch falls off reapply it. If you cannot reapply the patch, apply a new patch to another area and continue to follow your usual dose schedule. What  side effects may I notice from receiving this medicine? Side effects that you should report to your doctor or health care professional as soon as possible: -allergic reactions like skin rash, itching or hives, swelling of the face, lips, or tongue -breast tissue changes or discharge -changes in vision -chest pain -confusion, trouble speaking or understanding -dark urine -general ill feeling or flu-like symptoms -light-colored stools -nausea, vomiting -pain, swelling, warmth in the leg -right upper belly pain -severe headaches -shortness of breath -sudden numbness or weakness of the face, arm or leg -trouble walking, dizziness, loss of balance or coordination -unusual vaginal bleeding -yellowing of the eyes or skin Side effects that usually do not require medical attention (report to your doctor or health care professional if they continue or are bothersome): -hair loss -increased hunger or thirst -increased urination -symptoms of vaginal infection like itching, irritation or unusual discharge -unusually weak or tired This list may not describe all possible side effects. Call your doctor for medical advice about side effects. You may report side effects to FDA at 1-800-FDA-1088. Where should I keep my medicine? Keep out of the reach of children. Store at room temperature below 30 degrees C (86 degrees F). Do not store any patches that have been removed from their protective pouch. Throw away any unused medicine after the expiration date. Dispose of used patches properly. Since used patches may still contain active hormones, fold the patch in half so that it sticks to itself prior to disposal. NOTE: This sheet  is a summary. It may not cover all possible information. If you have questions about this medicine, talk to your doctor, pharmacist, or health care provider.  2018 Elsevier/Gold Standard (2016-01-10 12:58:11)

## 2017-09-04 NOTE — Progress Notes (Signed)
HPI:      [name] is a 52 y.o. [phone] who LMP was in the past, she presents today for her annual examination.  The patient has no complaints today. The patient is sexually active. Pt reports pain on entry w intercourse; dryness.  Also has hot flashes and mood changes, irritability.  Herlast pap: approximate date 2016 and was normal and last mammogram: approximate date 2017 and was normal.  The patient does perform self breast exams.  There is no notable family history of breast or ovarian cancer in her family. The patient is not currently taking hormone replacement therapy. Patient denies post-menopausal vaginal bleeding.   The patient has regular exercise: yes. The patient denies current symptoms of depression.  Vulavr bump (cyst) still present.  No recent HSV concerns unless misses dose Valtrex after sex (trigger for her).  GYN Hx: Last Colonoscopy:never ago. Last DEXA: never ago.    PMHx: Past Medical History:  Diagnosis Date  . Allergic rhinitis    rhinitis  . Genital herpes   . Hiatal hernia    EGD @ 18  . Marijuana smoker    Past Surgical History:  Procedure Laterality Date  . ABDOMINAL HYSTERECTOMY     partical  . ANKLE SURGERY     bilateral spurs  . GANGLION CYST EXCISION     right wrist  . TONSILLECTOMY    . TUBAL LIGATION     Family History  Problem Relation Age of Onset  . Hyperlipidemia Mother   . Breast cancer Mother   . Other Mother        brain blockage-stent placement  . Lung cancer Father   . COPD Father   . Coronary artery disease Father 26       had CABG   . Fibromyalgia Sister    Social History   Tobacco Use  . Smoking status: Never Smoker  . Smokeless tobacco: Never Used  . Tobacco comment: Uses marijuana,daily use for over 15 years , still uses regularly but not daily  Substance Use Topics  . Alcohol use: Yes    Comment: occasional  . Drug use: No    Comment: marijuana    Current Outpatient Medications:  .  acetaminophen  (TYLENOL) 500 MG tablet, Take 500 mg by mouth every 8 (eight) hours as needed., Disp: , Rfl:  .  Cetirizine HCl 10 MG CAPS, Take by mouth., Disp: , Rfl:  .  Simethicone (GAS RELIEF 80 PO), Take 1 capsule by mouth daily., Disp: , Rfl:  .  SUMAtriptan (IMITREX) 20 MG/ACT nasal spray, Place 1 spray (20 mg total) into the nose every 2 (two) hours as needed for migraine., Disp: 1 Inhaler, Rfl: 5 .  valACYclovir (VALTREX) 500 MG tablet, Take 1 tablet (500 mg total) by mouth as needed., Disp: 30 tablet, Rfl: 6 .  Diclofenac Sodium 3 % GEL, diclofenac 3 % topical gel  APPLY TO LESION AREAS BY TOPICAL ROUTE 2 TIMES PER DAY, Disp: , Rfl:  .  dicyclomine (BENTYL) 20 MG tablet, Take 1 tablet (20 mg total) by mouth 4 (four) times daily -  before meals and at bedtime. (Patient not taking: Reported on 03/06/2017), Disp: 120 tablet, Rfl: 3 .  esomeprazole (NEXIUM) 20 MG capsule, Take 1 capsule (20 mg total) by mouth daily. (Patient not taking: Reported on 09/04/2017), Disp: 30 capsule, Rfl: 5 .  estradiol (CLIMARA - DOSED IN MG/24 HR) 0.075 mg/24hr patch, Place 1 patch (0.075 mg total) onto the skin  once a week., Disp: 4 patch, Rfl: 11 .  Multiple Vitamin (MULTIVITAMIN) tablet, Take 1 tablet by mouth daily., Disp: , Rfl:  Allergies: Codeine  Review of Systems  Constitutional: Negative for chills, fever and malaise/fatigue.  HENT: Positive for congestion. Negative for sinus pain and sore throat.   Eyes: Negative for blurred vision and pain.  Respiratory: Positive for cough. Negative for wheezing.   Cardiovascular: Negative for chest pain and leg swelling.  Gastrointestinal: Positive for constipation. Negative for abdominal pain, diarrhea, heartburn, nausea and vomiting.  Genitourinary: Negative for dysuria, frequency, hematuria and urgency.  Musculoskeletal: Positive for joint pain. Negative for back pain, myalgias and neck pain.  Skin: Negative for itching and rash.  Neurological: Negative for dizziness, tremors  and weakness.  Endo/Heme/Allergies: Does not bruise/bleed easily.  Psychiatric/Behavioral: Negative for depression. The patient is not nervous/anxious and does not have insomnia.    Objective: BP 120/80   Ht 5' 3.5" (1.613 m)   Wt 164 lb (74.4 kg)   BMI 28.60 kg/m   Filed Weights   09/04/17 0807  Weight: 164 lb (74.4 kg)   Body mass index is 28.6 kg/m. Physical Exam  Constitutional: She is oriented to person, place, and time. She appears well-developed and well-nourished. No distress.  Genitourinary: Rectum normal and vagina normal. Pelvic exam was performed with patient supine. There is no rash or lesion on the right labia. There is no rash or lesion on the left labia. Vagina exhibits no lesion. No bleeding in the vagina. Right adnexum does not display mass and does not display tenderness. Left adnexum does not display mass and does not display tenderness. Cervix does not exhibit motion tenderness, lesion, friability or polyp.  Genitourinary Comments: Right labial seb cyst  HENT:  Head: Normocephalic and atraumatic. Head is without laceration.  Right Ear: Hearing normal.  Left Ear: Hearing normal.  Nose: No epistaxis.  No foreign bodies.  Mouth/Throat: Uvula is midline, oropharynx is clear and moist and mucous membranes are normal.  Eyes: Pupils are equal, round, and reactive to light.  Neck: Normal range of motion. Neck supple. No thyromegaly present.  Cardiovascular: Normal rate and regular rhythm. Exam reveals no gallop and no friction rub.  No murmur heard. Pulmonary/Chest: Effort normal and breath sounds normal. No respiratory distress. She has no wheezes. Right breast exhibits no mass, no skin change and no tenderness. Left breast exhibits no mass, no skin change and no tenderness.  Abdominal: Soft. Bowel sounds are normal. She exhibits no distension. There is no tenderness. There is no rebound.  Musculoskeletal: Normal range of motion.  Neurological: She is alert and oriented  to person, place, and time. No cranial nerve deficit.  Skin: Skin is warm and dry.  Psychiatric: She has a normal mood and affect. Judgment normal.  Vitals reviewed.  Assessment: Annual Exam 1. Annual physical exam   2. Hypercholesteremia   3. Screening for cervical cancer   4. Screening for breast cancer   5. Screen for colon cancer   6. Menopausal and postmenopausal disorder    Plan:            1.  Cervical Screening-  Pap smear done today  2. Breast screening- Exam annually and mammogram scheduled  3. Colonoscopy every 10 years, Hemoccult testing after age 11  4. Labs Ordered today  5. Counseling for hormonal therapy: wants to change HRT or dose due to hot flashes, moodiness, vaginal dryness and dyspareunia - - - will restart ERT Patch therapy  now and re-eval 2 mos.  Consider Adyi for femal arousal/lubrication concerns    F/U  Return in about 2 months (around 11/04/2017) for Follow up.  Annamarie MajorPaul Harris, MD, Merlinda FrederickFACOG [facility], [facility] 09/04/2017  8:41 AM

## 2017-09-05 LAB — LIPID PANEL
CHOL/HDL RATIO: 4.4 ratio (ref 0.0–4.4)
Cholesterol, Total: 249 mg/dL — ABNORMAL HIGH (ref 100–199)
HDL: 56 mg/dL (ref >39)
LDL Calculated: 178 mg/dL — ABNORMAL HIGH (ref 0–99)
Triglycerides: 76 mg/dL (ref 0–149)
VLDL CHOLESTEROL CAL: 15 mg/dL (ref 5–40)

## 2017-09-06 ENCOUNTER — Encounter: Payer: Self-pay | Admitting: Obstetrics & Gynecology

## 2017-09-06 LAB — IGP, APTIMA HPV
HPV Aptima: NEGATIVE
PAP Smear Comment: 0

## 2017-09-16 ENCOUNTER — Ambulatory Visit: Payer: BLUE CROSS/BLUE SHIELD | Admitting: Podiatry

## 2017-09-16 DIAGNOSIS — M2012 Hallux valgus (acquired), left foot: Secondary | ICD-10-CM

## 2017-09-16 DIAGNOSIS — M722 Plantar fascial fibromatosis: Secondary | ICD-10-CM

## 2017-09-16 NOTE — Progress Notes (Signed)
She presents today stating that she is ready for surgery.  She states that the toe is limiting my ability to perform her daily activities.  She states is very painful when she first does have a bed in the morning particularly around the heel and if she has any weight on the first metatarsophalangeal joint is exquisitely painful.  She would like to discuss surgical intervention today.  Objective: I have reviewed her past medical history medications allergy surgeries and social history.  Pulses are palpable neurologic sensorium is intact deep tendon reflexes are intact muscle strength +5/5 dorsiflexors plantar flexors inverters everters all intrinsic musculature is intact.  Orthopedic evaluation demonstrates pain on palpation medial calcaneal tubercle of the left heel.  She has pain on range of motion dorsiflexion and plantarflexion with pain on palpation of the medial aspect of the first metatarsophalangeal joint.  I reviewed radiographs demonstrating hypertrophic medial condyle as well as joint space narrowing subchondral sclerosis and early osteoarthritic changes first metatarsophalangeal joint.  Assessment: Chronic capsulitis osteoarthritis with hallux limitus first metatarsophalangeal joint left foot.  Chronic plantar fasciitis left foot.  Plan: Discussed etiology pathology conservative versus surgical therapies.  At this point we have consented her today for an Nelma RothmanAustin Youngswick osteotomy with screw possible Keller arthroplasty with a single silicone implant and a endoscopic plantar fasciotomy.  We discussed the possible postop complications which may include but are not limited to postop pain bleeding swelling infection recurrence need for further surgery overcorrection and under correction.  We also provided her with both oral and written home-going instructions for preop also provided her with information regarding the surgery center as well as the anesthesia group.  A Cam walker was dispensed today I  will follow-up with her in the near future for surgical intervention.

## 2017-09-16 NOTE — Patient Instructions (Signed)
Pre-Operative Instructions  Congratulations, you have decided to take an important step towards improving your quality of life.  You can be assured that the doctors and staff at [facility] will be with you every step of the way.  Here are some important things you should know:  1. Plan to be at the surgery center/hospital at least 1 (one) hour prior to your scheduled time, unless otherwise directed by the surgical center/hospital staff.  You must have a responsible adult accompany you, remain during the surgery and drive you home.  Make sure you have directions to the surgical center/hospital to ensure you arrive on time. 2. If you are having surgery at [facility] or [facility], you will need a copy of your medical history and physical form from your family physician within one month prior to the date of surgery. We will give you a form for your primary physician to complete.  3. We make every effort to accommodate the date you request for surgery.  However, there are times where surgery dates or times have to be moved.  We will contact you as soon as possible if a change in schedule is required.   4. No aspirin/ibuprofen for one week before surgery.  If you are on aspirin, any non-steroidal anti-inflammatory medications (Mobic, Aleve, Ibuprofen) should not be taken seven (7) days prior to your surgery.  You make take Tylenol for pain prior to surgery.  5. Medications - If you are taking daily heart and blood pressure medications, seizure, reflux, allergy, asthma, anxiety, pain or diabetes medications, make sure you notify the surgery center/hospital before the day of surgery so they can tell you which medications you should take or avoid the day of surgery. 6. No food or drink after midnight the night before surgery unless directed otherwise by surgical center/hospital staff. 7. No alcoholic beverages 24-hours prior to surgery.  No smoking 24-hours prior or 24-hours after  surgery. 8. Wear loose pants or shorts. They should be loose enough to fit over bandages, boots, and casts. 9. Don't wear slip-on shoes. Sneakers are preferred. 10. Bring your boot with you to the surgery center/hospital.  Also bring crutches or a walker if your physician has prescribed it for you.  If you do not have this equipment, it will be provided for you after surgery. 11. If you have not been contacted by the surgery center/hospital by the day before your surgery, call to confirm the date and time of your surgery. 12. Leave-time from work may vary depending on the type of surgery you have.  Appropriate arrangements should be made prior to surgery with your employer. 13. Prescriptions will be provided immediately following surgery by your doctor.  Fill these as soon as possible after surgery and take the medication as directed. Pain medications will not be refilled on weekends and must be approved by the doctor. 14. Remove nail polish on the operative foot and avoid getting pedicures prior to surgery. 15. Wash the night before surgery.  The night before surgery wash the foot and leg well with water and the antibacterial soap provided. Be sure to pay special attention to beneath the toenails and in between the toes.  Wash for at least three (3) minutes. Rinse thoroughly with water and dry well with a towel.  Perform this wash unless told not to do so by your physician.  Enclosed: 1 Ice pack (please put in freezer the night before surgery)   1 Hibiclens skin cleaner     Pre-op instructions  If you have any questions regarding the instructions, please do not hesitate to call our office.  [place]: [address], [place], [place] [address] -- [phone]  [place]: [address] [place], [place] [address] -- [phone]  [place]: [address]  [place], [place] [address] -- [phone]  [place]: [address], [address], [place], [place] [address] -- [phone]  Website:  [email or web address] 

## 2017-09-26 ENCOUNTER — Encounter: Payer: Self-pay | Admitting: General Surgery

## 2017-09-26 ENCOUNTER — Ambulatory Visit (INDEPENDENT_AMBULATORY_CARE_PROVIDER_SITE_OTHER): Payer: BLUE CROSS/BLUE SHIELD | Admitting: General Surgery

## 2017-09-26 VITALS — BP 132/72 | HR 77 | Resp 16 | Ht 63.5 in | Wt 165.0 lb

## 2017-09-26 DIAGNOSIS — Z1211 Encounter for screening for malignant neoplasm of colon: Secondary | ICD-10-CM | POA: Diagnosis not present

## 2017-09-26 NOTE — Patient Instructions (Addendum)
The patient is aware to call back for any questions or new concerns. Discussed cologuard vs colonoscopy She will decide and call back.   Colonoscopy, Adult A colonoscopy is an exam to look at the entire large intestine. During the exam, a lubricated, bendable tube is inserted into the anus and then passed into the rectum, colon, and other parts of the large intestine. A colonoscopy is often done as a part of normal colorectal screening or in response to certain symptoms, such as anemia, persistent diarrhea, abdominal pain, and blood in the stool. The exam can help screen for and diagnose medical problems, including:  Tumors.  Polyps.  Inflammation.  Areas of bleeding.  Tell a health care provider about:  Any allergies you have.  All medicines you are taking, including vitamins, herbs, eye drops, creams, and over-the-counter medicines.  Any problems you or family members have had with anesthetic medicines.  Any blood disorders you have.  Any surgeries you have had.  Any medical conditions you have.  Any problems you have had passing stool. What are the risks? Generally, this is a safe procedure. However, problems may occur, including:  Bleeding.  A tear in the intestine.  A reaction to medicines given during the exam.  Infection (rare).  What happens before the procedure? Eating and drinking restrictions Follow instructions from your health care provider about eating and drinking, which may include:  A few days before the procedure - follow a low-fiber diet. Avoid nuts, seeds, dried fruit, raw fruits, and vegetables.  1-3 days before the procedure - follow a clear liquid diet. Drink only clear liquids, such as clear broth or bouillon, black coffee or tea, clear juice, clear soft drinks or sports drinks, gelatin dessert, and popsicles. Avoid any liquids that contain red or purple dye.  On the day of the procedure - do not eat or drink anything during the 2 hours  before the procedure, or within the time period that your health care provider recommends.  Bowel prep If you were prescribed an oral bowel prep to clean out your colon:  Take it as told by your health care provider. Starting the day before your procedure, you will need to drink a large amount of medicated liquid. The liquid will cause you to have multiple loose stools until your stool is almost clear or light green.  If your skin or anus gets irritated from diarrhea, you may use these to relieve the irritation: ? Medicated wipes, such as adult wet wipes with aloe and vitamin E. ? A skin soothing-product like petroleum jelly.  If you vomit while drinking the bowel prep, take a break for up to 60 minutes and then begin the bowel prep again. If vomiting continues and you cannot take the bowel prep without vomiting, call your health care provider.  General instructions  Ask your health care provider about changing or stopping your regular medicines. This is especially important if you are taking diabetes medicines or blood thinners.  Plan to have someone take you home from the hospital or clinic. What happens during the procedure?  An IV tube may be inserted into one of your veins.  You will be given medicine to help you relax (sedative).  To reduce your risk of infection: ? Your health care team will wash or sanitize their hands. ? Your anal area will be washed with soap.  You will be asked to lie on your side with your knees bent.  Your health care provider will lubricate  a long, thin, flexible tube. The tube will have a camera and a light on the end.  The tube will be inserted into your anus.  The tube will be gently eased through your rectum and colon.  Air will be delivered into your colon to keep it open. You may feel some pressure or cramping.  The camera will be used to take images during the procedure.  A small tissue sample may be removed from your body to be examined  under a microscope (biopsy). If any potential problems are found, the tissue will be sent to a lab for testing.  If small polyps are found, your health care provider may remove them and have them checked for cancer cells.  The tube that was inserted into your anus will be slowly removed. The procedure may vary among health care providers and hospitals. What happens after the procedure?  Your blood pressure, heart rate, breathing rate, and blood oxygen level will be monitored until the medicines you were given have worn off.  Do not drive for 24 hours after the exam.  You may have a small amount of blood in your stool.  You may pass gas and have mild abdominal cramping or bloating due to the air that was used to inflate your colon during the exam.  It is up to you to get the results of your procedure. Ask your health care provider, or the department performing the procedure, when your results will be ready. This information is not intended to replace advice given to you by your health care provider. Make sure you discuss any questions you have with your health care provider. Document Released: 06/15/2000 Document Revised: 04/18/2016 Document Reviewed: 08/30/2015 Elsevier Interactive Patient Education  2018 [employer]

## 2017-09-26 NOTE — Progress Notes (Signed)
Patient ID: Kelly Yang, female   DOB: 09-27-1965, 52 y.o.   MRN: [id number]  Chief Complaint  Patient presents with  . Colonoscopy    HPI Kelly Yang is a 52 y.o. female here today for a evaluating of a screening colonoscopy referred by Dr Tiburcio PeaHarris. Patient states no GI problems at this time. Bowels move daily. She states she occasionally has upper abdominal pain. She has changed her dietary habits which she thinks has helped.  No family history colon cancer.   HPI  Past Medical History:  Diagnosis Date  . Allergic rhinitis    rhinitis  . Genital herpes   . Hiatal hernia   . Marijuana smoker     Past Surgical History:  Procedure Laterality Date  . ABDOMINAL HYSTERECTOMY     partical  . ANKLE SURGERY     bilateral spurs  . BACK SURGERY  2016  . GANGLION CYST EXCISION     right wrist  . TONSILLECTOMY    . TUBAL LIGATION    . UPPER GI ENDOSCOPY  04/13/2011, 04-19-16   Dr Sheryn Bisonavid [place], Dr Mechele CollinElliott    Family History  Problem Relation Age of Onset  . Hyperlipidemia Mother        2570?  Marland Kitchen. Breast cancer Mother   . Other Mother        brain blockage-stent placement  . Bladder Cancer Mother        7170?  Marland Kitchen. Lung cancer Father   . COPD Father   . Coronary artery disease Father [id number]       had CABG   . Fibromyalgia Sister   . Colon cancer Neg Hx     Social History Social History   Tobacco Use  . Smoking status: Never Smoker  . Smokeless tobacco: Never Used  . Tobacco comment: Uses marijuana,daily use for over 15 years , still uses regularly but not daily  Substance Use Topics  . Alcohol use: Yes    Comment: occasional  . Drug use: Yes    Types: Marijuana    Comment: marijuana    Allergies  Allergen Reactions  . Codeine     Other reaction(s): Vomiting    Current Outpatient Medications  Medication Sig Dispense Refill  . acetaminophen (TYLENOL) 500 MG tablet Take 500 mg by mouth every 8 (eight) hours as needed.    . Cetirizine HCl 10 MG CAPS Take by mouth.     . Cholecalciferol (VITAMIN D3) 1000 units CAPS Take by mouth daily.    Marland Kitchen. estradiol (CLIMARA - DOSED IN MG/24 HR) 0.075 mg/24hr patch Place 1 patch (0.075 mg total) onto the skin once a week. 4 patch 11  . Multiple Vitamin (MULTIVITAMIN) tablet Take 1 tablet by mouth daily.    . naproxen sodium (ALEVE) 220 MG tablet Take 220 mg by mouth 2 (two) times daily as needed.    Marland Kitchen. omeprazole (PRILOSEC) 20 MG capsule Take 20 mg by mouth daily.    . Simethicone (GAS RELIEF 80 PO) Take 1 capsule by mouth daily.    . SUMAtriptan (IMITREX) 20 MG/ACT nasal spray Place 1 spray (20 mg total) into the nose every 2 (two) hours as needed for migraine. 1 Inhaler 5  . valACYclovir (VALTREX) 500 MG tablet Take 1 tablet (500 mg total) by mouth as needed. 30 tablet 6   No current facility-administered medications for this visit.     Review of Systems Review of Systems  Constitutional: Negative.   Respiratory: Negative.  Cardiovascular: Negative.   Gastrointestinal: Negative.     Blood pressure 132/72, pulse 77, resp. rate 16, height 5' 3.5" (1.613 m), weight 165 lb (74.8 kg), SpO2 98 %.  Physical Exam Physical Exam  Constitutional: She is oriented to person, place, and time. She appears well-developed and well-nourished.  HENT:  Mouth/Throat: Oropharynx is clear and moist.  Eyes: Conjunctivae are normal. No scleral icterus.  Neck: Neck supple.  Cardiovascular: Normal rate, regular rhythm and normal heart sounds.  Pulmonary/Chest: Effort normal and breath sounds normal.  Lymphadenopathy:    She has no cervical adenopathy.  Neurological: She is alert and oriented to person, place, and time.  Skin: Skin is warm and dry.  Psychiatric: Her behavior is normal.    Data Reviewed CBC dated March 06, 2017 showed a hemoglobin of 14.9 with an MCV of 88.8 and white blood cell count of 6900.  Assessment    Candidate for screening colonoscopy    Plan  Discussed cologuard vs colonoscopy.  Patient is  unclear how she would like to proceed.  She will contact the office if she would like to proceed with either screening measure.  Colonoscopy with possible biopsy/polypectomy prn: Information regarding the procedure, including its potential risks and complications (including but not limited to perforation of the bowel, which may require emergency surgery to repair, and bleeding) was verbally given to the patient. Educational information regarding lower intestinal endoscopy was given to the patient. Written instructions for how to complete the bowel prep using Miralax were provided. The importance of drinking ample fluids to avoid dehydration as a result of the prep emphasized.  She will decide and call back.  HPI, Physical Exam, Assessment and Plan have been scribed under the direction and in the presence of Earline Mayotte, MD. Dorathy Daft, RN     I have completed the exam and reviewed the above documentation for accuracy and completeness.  I agree with the above.  Museum/gallery conservator has been used and any errors in dictation or transcription are unintentional.  Donnalee Curry, M.D., F.A.C.S.   Merrily Pew Monisha Siebel 09/27/2017, 4:34 PM

## 2017-09-27 DIAGNOSIS — Z1211 Encounter for screening for malignant neoplasm of colon: Secondary | ICD-10-CM | POA: Insufficient documentation

## 2017-10-04 ENCOUNTER — Encounter: Payer: Self-pay | Admitting: General Surgery

## 2017-10-15 ENCOUNTER — Other Ambulatory Visit: Payer: Self-pay | Admitting: Podiatry

## 2017-10-15 MED ORDER — CEPHALEXIN 500 MG PO CAPS
500.0000 mg | ORAL_CAPSULE | Freq: Three times a day (TID) | ORAL | 0 refills | Status: DC
Start: 2017-10-15 — End: 2017-12-04

## 2017-10-15 MED ORDER — OXYCODONE-ACETAMINOPHEN 10-325 MG PO TABS: 1.0000 | ORAL_TABLET | Freq: Four times a day (QID) | ORAL | 0 refills | Status: AC | PRN

## 2017-10-15 MED ORDER — ONDANSETRON HCL 4 MG PO TABS: 4.0000 mg | ORAL_TABLET | Freq: Three times a day (TID) | ORAL | 0 refills | Status: DC | PRN

## 2017-10-18 ENCOUNTER — Encounter: Payer: Self-pay | Admitting: Podiatry

## 2017-10-18 DIAGNOSIS — R0683 Snoring: Secondary | ICD-10-CM | POA: Diagnosis not present

## 2017-10-18 DIAGNOSIS — M2012 Hallux valgus (acquired), left foot: Secondary | ICD-10-CM | POA: Diagnosis not present

## 2017-10-18 DIAGNOSIS — M25572 Pain in left ankle and joints of left foot: Secondary | ICD-10-CM | POA: Diagnosis not present

## 2017-10-18 DIAGNOSIS — M722 Plantar fascial fibromatosis: Secondary | ICD-10-CM | POA: Diagnosis not present

## 2017-10-21 ENCOUNTER — Telehealth: Payer: Self-pay | Admitting: *Deleted

## 2017-10-21 NOTE — Telephone Encounter (Signed)
POST OP CALL-    1) General condition stated by the patient: Okay  2) Is the pt having pain? Throbbing  3) Pain score: 5  4) Has the pt taken Rx'd medication?  Once, gave a headache, but been using Ibuprofen  5) Is the pain medication giving relief? Just taking Ibuprofen and yes  6) Any fever, chills, nausea, or vomiting? Some nausea from pain meds  7) Any shortness of breath or tightness in the calf? No  8) Is the bandages clean, dry and intact? Yes  9) Is the bandage excessively tight? No  10) Is there excessive bleeding or drainage coming through the bandage? No  11) Did you understand all of the post op instruction sheet given? Yes  12) Any questions or concerns regarding post op care/recovery? No    Confirmed POV appointment with patient

## 2017-10-25 ENCOUNTER — Ambulatory Visit (INDEPENDENT_AMBULATORY_CARE_PROVIDER_SITE_OTHER): Payer: BLUE CROSS/BLUE SHIELD | Admitting: Podiatry

## 2017-10-25 ENCOUNTER — Ambulatory Visit (INDEPENDENT_AMBULATORY_CARE_PROVIDER_SITE_OTHER): Payer: BLUE CROSS/BLUE SHIELD

## 2017-10-25 ENCOUNTER — Encounter: Payer: Self-pay | Admitting: Podiatry

## 2017-10-25 VITALS — BP 119/66 | HR 64 | Temp 99.0°F

## 2017-10-25 DIAGNOSIS — M2012 Hallux valgus (acquired), left foot: Secondary | ICD-10-CM

## 2017-10-28 NOTE — Progress Notes (Signed)
   Subjective:  Patient presents today status post bunionectomy and EPF of the left foot. DOS: 10/18/17. He states he is experiencing some intermittent throbbing pain. He does report some improvement though. He has been taking Ibuprofen which helps alleviate the symptoms. He denies fever, chills, nausea or vomiting. Patient is here for further evaluation and treatment.    Past Medical History:  Diagnosis Date  . Allergic rhinitis    rhinitis  . Genital herpes   . Hiatal hernia   . Marijuana smoker       Objective/Physical Exam Neurovascular status intact.  Skin incisions appear to be well coapted with sutures and staples intact. No sign of infectious process noted. No dehiscence. No active bleeding noted. Moderate edema noted to the surgical extremity.  Radiographic Exam:  Orthopedic hardware and osteotomies sites appear to be stable with routine healing.  Assessment: 1. s/p bunionectomy and EPF of the left foot. DOS: 10/18/17   Plan of Care:  1. Patient was evaluated. X-rays reviewed 2. Dressing changed. Keep clean, dry and intact for one week.  3. Weightbearing as tolerated in CAM boot.  4. Return to clinic in one week.    Felecia Shelling, DPM [facility]  Dr. Felecia Shelling, DPM    [address]                                        [place], [place] [address]                Office [phone]  Fax [phone]

## 2017-10-30 ENCOUNTER — Ambulatory Visit (INDEPENDENT_AMBULATORY_CARE_PROVIDER_SITE_OTHER): Payer: BLUE CROSS/BLUE SHIELD | Admitting: Podiatry

## 2017-10-30 ENCOUNTER — Encounter: Payer: BLUE CROSS/BLUE SHIELD | Admitting: Podiatry

## 2017-10-30 DIAGNOSIS — M2012 Hallux valgus (acquired), left foot: Secondary | ICD-10-CM

## 2017-10-30 DIAGNOSIS — M722 Plantar fascial fibromatosis: Secondary | ICD-10-CM

## 2017-10-30 DIAGNOSIS — Z9889 Other specified postprocedural states: Secondary | ICD-10-CM

## 2017-10-31 ENCOUNTER — Telehealth: Payer: Self-pay | Admitting: Podiatry

## 2017-10-31 NOTE — Progress Notes (Signed)
Subjective: Kelly Yang is a 52 y.o. is seen today in office s/p left foot EPF and Austin bunionectomy preformed on 10/18/2017 with Dr. Al Corpus.  She states her pain is improving.  She has been in the cam boot.  She presented for suture removal. Denies any systemic complaints such as fevers, chills, nausea, vomiting. No calf pain, chest pain, shortness of breath.   Objective: General: No acute distress, AAOx3  DP/PT pulses palpable 2/4, CRT < 3 sec to all digits.  Protective sensation intact. Motor function intact.  Left foot: Incision is well coapted without any evidence of dehiscence sutures are intact. There is no surrounding erythema, ascending cellulitis, fluctuance, crepitus, malodor, drainage/purulence. There is mild edema around the surgical site. There is mild pain along the surgical site.  No other areas of tenderness to bilateral lower extremities.  No other open lesions or pre-ulcerative lesions.  No pain with calf compression, swelling, warmth, erythema.   Assessment and Plan:  Status post left foot surgery, doing well with no complications   -Treatment options discussed including all alternatives, risks, and complications -Sutures to the EPF site were removed today without complication after removal incision remained well coapted.  Antibiotic ointment is applied to all the incisions followed by dry sterile dressing.  On Friday she can take the bandage off and start to shower as long as the incision remains well coapted.  She can continue with similar dressings at home. -Continue cam boot at all times. -Ice/elevation -Pain medication as needed. -Monitor for any clinical signs or symptoms of infection and DVT/PE and directed to call the office immediately should any occur or go to the ER. -Follow-up 2 weeks with Dr. Al Corpus or sooner if any problems arise. In the meantime, encouraged to call the office with any questions, concerns, change in symptoms.   Ovid Curd, DPM

## 2017-10-31 NOTE — Telephone Encounter (Signed)
error 

## 2017-11-01 ENCOUNTER — Encounter: Payer: Self-pay | Admitting: Podiatry

## 2017-11-01 ENCOUNTER — Ambulatory Visit (INDEPENDENT_AMBULATORY_CARE_PROVIDER_SITE_OTHER): Payer: BLUE CROSS/BLUE SHIELD | Admitting: Podiatry

## 2017-11-01 DIAGNOSIS — Z9889 Other specified postprocedural states: Secondary | ICD-10-CM

## 2017-11-04 NOTE — Progress Notes (Signed)
   Subjective:  Patient presents today status post EPF and bunionectomy left. DOS: 10/18/17. She reports numbness to the LLE that began two days ago. She describes it as feeling like it is asleep. She reports associated swelling of the toes. She has been wearing the CAM boot as directed. Patient is here for further evaluation and treatment.    Past Medical History:  Diagnosis Date  . Allergic rhinitis    rhinitis  . Genital herpes   . Hiatal hernia   . Marijuana smoker       Objective/Physical Exam Neurovascular status intact.  Skin incisions appear to be well coapted with sutures and staples intact. No sign of infectious process noted. No dehiscence. No active bleeding noted. Moderate edema noted to the surgical extremity.   Assessment: 1. s/p EPF and bunionectomy left. DOS: 10/18/17   Plan of Care:  1. Patient was evaluated. 2. Assure patient that post surgical numbness is WNL.  3. Recommended loosening CAM boot as needed.  4. Continue weightbearing in CAM boot.  5. Return to clinic at next scheduled appointment with Dr. Al Corpus.    Felecia Shelling, DPM [facility]  Dr. Felecia Shelling, DPM    [address]                                        [place], [place] [address]                Office [phone]  Fax [phone]

## 2017-11-05 ENCOUNTER — Ambulatory Visit (INDEPENDENT_AMBULATORY_CARE_PROVIDER_SITE_OTHER): Payer: BLUE CROSS/BLUE SHIELD | Admitting: Obstetrics & Gynecology

## 2017-11-05 ENCOUNTER — Encounter: Payer: Self-pay | Admitting: Obstetrics & Gynecology

## 2017-11-05 VITALS — BP 120/80 | Ht 65.0 in | Wt 155.0 lb

## 2017-11-05 DIAGNOSIS — N959 Unspecified menopausal and perimenopausal disorder: Secondary | ICD-10-CM | POA: Diagnosis not present

## 2017-11-05 MED ORDER — PREMARIN 0.9 MG PO TABS: 0.9000 mg | ORAL_TABLET | Freq: Every day | ORAL | 3 refills | Status: DC

## 2017-11-05 MED ORDER — PREMARIN 0.9 MG PO TABS: 0.9000 mg | ORAL_TABLET | Freq: Every day | ORAL | 11 refills | Status: DC

## 2017-11-05 NOTE — Progress Notes (Signed)
  History of Present Illness:  Kelly Yang is a 52 y.o. who was started on  ERT PATCH approximately 2 months ago. Since that time, she states that her symptoms are improving.  However, the patch does not stay on well at all (esp w water) and leaves marks on skin.  Improved libido and vag dryness as well.  PMHx: She  has a past medical history of Allergic rhinitis, Genital herpes, Hiatal hernia, and Marijuana smoker. Also,  has a past surgical history that includes Tubal ligation; Abdominal hysterectomy; Tonsillectomy; Ankle surgery; Ganglion cyst excision; Back surgery (2016); and Upper gi endoscopy (04/13/2011, 04-19-16)., family history includes Bladder Cancer in her mother; Breast cancer in her mother; COPD in her father; Coronary artery disease (age of onset: 73) in her father; Fibromyalgia in her sister; Hyperlipidemia in her mother; Lung cancer in her father; Other in her mother.,  reports that she has never smoked. She has never used smokeless tobacco. She reports that she drinks alcohol. She reports that she has current or past drug history. Drug: Marijuana. No outpatient medications have been marked as taking for the 11/05/17 encounter (Office Visit) with Nadara Mustard, MD.  . Also, is allergic to codeine..  Review of Systems  All other systems reviewed and are negative.  Physical Exam:  BP 120/80   Ht  (1.651 m)   Wt 155 lb (70.3 kg)   BMI 25.79 kg/m  Body mass index is 25.79 kg/m. Constitutional: Well nourished, well developed female in no acute distress.  Abdomen: diffusely non tender to palpation, non distended, and no masses, hernias Neuro: Grossly intact Psych:  Normal mood and affect.    Assessment:  Problem List Items Addressed This Visit      Other   Menopausal and postmenopausal disorder - Primary     Medication treatment is helping her menopause sx's but does not tolerate delivery mode of patch well.  Plan: She will undergo a change to pill in her medical  therapy.    Premarin 0.9 mg daily eRx done to [facility].    Pros and cons, and SE, discussed w ERT    Can adjust dose as needed    Improved libido, no need for alternative therapies today She was amenable to this plan and we will see her back for annual/PRN.    Plans MMG soon    Cologuard as determined w Dr Lemar Livings to be done soon  A total of 15 minutes were spent face-to-face with the patient during this encounter and over half of that time dealt with counseling and coordination of care.  Annamarie Major, MD, [facility], [facility] 11/05/2017  8:21 AM

## 2017-11-05 NOTE — Patient Instructions (Signed)
Mammogram every year    Call [phone] to schedule at [facility]  Conjugated Estrogens tablets What is this medicine? CONJUGATED ESTROGENS (CON ju gate ed ESS troe jenz) is an estrogen. It is used as hormone replacement in menopausal women. It helps to treat hot flashes and prevent osteoporosis. It is also used to treat women with low hormone levels or in those who have had their ovaries removed. This medicine may be used for other purposes; ask your health care provider or pharmacist if you have questions. COMMON BRAND NAME(S): Premarin What should I tell my health care provider before I take this medicine? They need to know if you have any of these conditions: -abnormal vaginal bleeding -blood vessel disease or blood clots -breast, cervical, endometrial, ovarian, liver, or uterine cancer -dementia -diabetes -endometriosis -fibroids -gallbladder disease -heart disease or recent heart attack -high blood pressure -high cholesterol -high level of calcium in the blood -kidney disease -liver disease -mental depression -migraine headaches -protein C deficiency -protein S deficiency -stroke -tobacco smoker -an unusual or allergic reaction to estrogens, other medicines, foods, dyes, or preservatives -pregnant or trying to get pregnant -breast-feeding How should I use this medicine? Take this medicine by mouth with a glass of water. Follow the directions on the prescription label. Take your medicine at regular intervals, at the same time each day. Do not take your medicine more often than directed. A patient package insert for the product will be given with each prescription and refill. Read this sheet carefully each time. Talk to your pediatrician regarding the use of this medicine in children. This medicine is not approved for use in children. Overdosage: If you think you have taken too much of this medicine contact a poison control center or emergency room at once. NOTE: This  medicine is only for you. Do not share this medicine with others. What if I miss a dose? If you miss a dose, take it as soon as you can. If it is almost time for your next dose, take only that dose. Do not take double or extra doses. What may interact with this medicine? Do not take this medicine with any of the following medications: -aromatase inhibitors like aminoglutethimide, anastrozole, exemestane, letrozole, testolactone -metyrapone This medicine may also interact with the following medications: -barbiturates, such as phenobarbital -carbamazepine -clarithromycin -erythromycin -grapefruit juice -medicines for fungal infections like ketoconazole and itraconazole -phenytoin - rifampin -ritonavir -St. John's Wort -thyroid hormones This list may not describe all possible interactions. Give your health care provider a list of all the medicines, herbs, non-prescription drugs, or dietary supplements you use. Also tell them if you smoke, drink alcohol, or use illegal drugs. Some items may interact with your medicine. What should I watch for while using this medicine? Visit your health care professional for regular checks on your progress. You will need a regular breast and pelvic exam and Pap smear while on this medicine. You should also discuss the need for regular mammograms with your health care professional, and follow his or her guidelines for these tests. This medicine can make your body retain fluid, making your fingers, hands, or ankles swell. Your blood pressure can go up. Contact your doctor or health care professional if you feel you are retaining fluid. If you have any reason to think you are pregnant; stop taking this medicine at once and contact your doctor or health care professional. Smoking increases the risk of getting a blood clot or having a stroke while you are taking this medicine,  especially if you are more than 51 years old. You are strongly advised not to smoke. If you  wear contact lenses and notice visual changes, or if the lenses begin to feel uncomfortable, consult your eye care specialist. The tablet shell for some brands of this medicine does not dissolve. The tablet shell may appear whole in the stool. This is not a cause for concern. If you see something that resembles a tablet in your stool, talk to your healthcare provider. This medicine can increase the risk of developing a condition (endometrial hyperplasia) that may lead to cancer of the lining of the uterus. Taking progestins, another hormone drug, with this medicine lowers the risk of developing this condition. Therefore, if your uterus has not been removed (by a hysterectomy), your doctor may prescribe a progestin for you to take together with your estrogen. You should know, however, that taking estrogens with progestins may have additional health risks. You should discuss the use of estrogens and progestins with your health care professional to determine the benefits and risks for you. If you are going to have surgery, you may need to stop taking this medicine. Consult your health care professional for advice before you schedule the surgery. What side effects may I notice from receiving this medicine? Side effects that you should report to your doctor or health care professional as soon as possible: -allergic reactions like skin rash, itching or hives, swelling of the face, lips, or tongue -breast tissue changes or discharge -changes in vision -chest pain -confusion, trouble speaking or understanding -dark urine -general ill feeling or flu-like symptoms -light-colored stools -nausea, vomiting -pain, swelling, warmth in the leg -right upper belly pain -severe headaches -shortness of breath -sudden numbness or weakness of the face, arm or leg -trouble walking, dizziness, loss of balance or coordination -unusual vaginal bleeding -yellowing of the eyes or skin Side effects that usually do not  require medical attention (report to your doctor or health care professional if they continue or are bothersome): -hair loss -increased hunger or thirst -increased urination -symptoms of vaginal infection like itching, irritation or unusual discharge -unusually weak or tired This list may not describe all possible side effects. Call your doctor for medical advice about side effects. You may report side effects to FDA at 1-800-FDA-1088. Where should I keep my medicine? Keep out of the reach of children. Store at room temperature between 15 and 30 degrees C (59 and 86 degrees F). Throw away any unused medicine after the expiration date. NOTE: This sheet is a summary. It may not cover all possible information. If you have questions about this medicine, talk to your doctor, pharmacist, or health care provider.  2018 Elsevier/Gold Standard (2010-09-20 09:20:56)

## 2017-11-20 ENCOUNTER — Ambulatory Visit (INDEPENDENT_AMBULATORY_CARE_PROVIDER_SITE_OTHER): Payer: BLUE CROSS/BLUE SHIELD

## 2017-11-20 ENCOUNTER — Encounter: Payer: Self-pay | Admitting: Podiatry

## 2017-11-20 ENCOUNTER — Other Ambulatory Visit: Payer: Self-pay | Admitting: Podiatry

## 2017-11-20 ENCOUNTER — Ambulatory Visit (INDEPENDENT_AMBULATORY_CARE_PROVIDER_SITE_OTHER): Payer: BLUE CROSS/BLUE SHIELD | Admitting: Podiatry

## 2017-11-20 DIAGNOSIS — M2012 Hallux valgus (acquired), left foot: Secondary | ICD-10-CM

## 2017-11-20 DIAGNOSIS — Z9889 Other specified postprocedural states: Secondary | ICD-10-CM

## 2017-11-20 NOTE — Progress Notes (Signed)
She presents today nearly 5 weeks status post endoscopic plantar fasciotomy of the left foot and an Austin bunion repair left foot.  She states is still tender on top has some real tender pain across the medial aspect of the first metatarsophalangeal joint medial to the incision.  She also has some tenderness in the plantar heel.  She states that she has not been sleeping with her cam walker except for the first week.  Objective: Vital signs are stable alert and oriented x3.  Pulses are palpable.  The foot is nice and rectus is in good position however there is some edema in the heel is some swelling of the first intermetatarsal space.  Radiographs confirm well-placed osteotomy with internal fixation in good position.  Assessment: At this point I will allow her to wear her tennis shoes encouraged her to go back and forth between tennis shoes and her cam walker I will follow-up with her when she [place].

## 2017-11-22 ENCOUNTER — Telehealth: Payer: Self-pay

## 2017-11-22 NOTE — Telephone Encounter (Signed)
Pt calling triage states that her and RPH discussed switching to Estradiol pill instead of patch. Premarin was sent in, and pt wanting the Estradiol pill form sent to [facility]. Please advise.

## 2017-11-26 ENCOUNTER — Other Ambulatory Visit: Payer: Self-pay | Admitting: Obstetrics & Gynecology

## 2017-11-26 DIAGNOSIS — M722 Plantar fascial fibromatosis: Secondary | ICD-10-CM | POA: Diagnosis not present

## 2017-11-26 MED ORDER — ESTRADIOL 1 MG PO TABS: 1.0000 mg | ORAL_TABLET | Freq: Every day | ORAL | 3 refills | Status: DC

## 2017-11-26 NOTE — Telephone Encounter (Signed)
Pt aware.

## 2017-11-26 NOTE — Telephone Encounter (Signed)
This change has been made, let her know

## 2017-12-04 ENCOUNTER — Ambulatory Visit (INDEPENDENT_AMBULATORY_CARE_PROVIDER_SITE_OTHER): Payer: BLUE CROSS/BLUE SHIELD

## 2017-12-04 ENCOUNTER — Encounter: Payer: Self-pay | Admitting: Podiatry

## 2017-12-04 ENCOUNTER — Ambulatory Visit (INDEPENDENT_AMBULATORY_CARE_PROVIDER_SITE_OTHER): Payer: BLUE CROSS/BLUE SHIELD | Admitting: Podiatry

## 2017-12-04 DIAGNOSIS — M2012 Hallux valgus (acquired), left foot: Secondary | ICD-10-CM | POA: Diagnosis not present

## 2017-12-04 DIAGNOSIS — M722 Plantar fascial fibromatosis: Secondary | ICD-10-CM

## 2017-12-04 DIAGNOSIS — Z9889 Other specified postprocedural states: Secondary | ICD-10-CM

## 2017-12-04 MED ORDER — METHYLPREDNISOLONE 4 MG PO TBPK: ORAL_TABLET | ORAL | 0 refills | Status: DC

## 2017-12-04 NOTE — Progress Notes (Signed)
She presents today for follow-up of her EPF and her bunion repair left foot.  States that there is a lot of swelling a lot of pain by the end of the day she is just thinking that it should have already been much better by now.  Objective: Vital signs stable she is alert oriented x3 still pain to palpation medial calcaneal tubercle of the left heel.  Great range of motion of the first metatarsophalangeal joint left.  Seen today demonstrate a well-healing osteotomy.  Assessment: Scar tissue medial aspect of the left heel.  Well-healing osteotomy screw fixation first metatarsal left.  Plan: At the point maximal tenderness I injected 20 mg Kenalog 5 mg Marcaine left heel.  Also started her on a Medrol Dosepak and I will follow-up with her in 3 to 4 weeks.

## 2017-12-05 ENCOUNTER — Ambulatory Visit: Payer: BLUE CROSS/BLUE SHIELD | Admitting: Podiatry

## 2017-12-18 ENCOUNTER — Ambulatory Visit (INDEPENDENT_AMBULATORY_CARE_PROVIDER_SITE_OTHER): Payer: BLUE CROSS/BLUE SHIELD | Admitting: Podiatry

## 2017-12-18 ENCOUNTER — Encounter: Payer: Self-pay | Admitting: Podiatry

## 2017-12-18 ENCOUNTER — Ambulatory Visit: Payer: BLUE CROSS/BLUE SHIELD

## 2017-12-18 DIAGNOSIS — M722 Plantar fascial fibromatosis: Secondary | ICD-10-CM

## 2017-12-18 DIAGNOSIS — M2012 Hallux valgus (acquired), left foot: Secondary | ICD-10-CM

## 2017-12-18 DIAGNOSIS — Z9889 Other specified postprocedural states: Secondary | ICD-10-CM

## 2017-12-18 NOTE — Progress Notes (Signed)
She presents today for follow-up of her Kelly Royalsustin bunionectomy and EPF.  She states that she has some stiffness and some tenderness along the medial aspect of the heel date of surgery October 18, 2017.  Objective: Vital signs are stable alert oriented x3 dorsiflexion is good and complete however plantar flexion is limited.  At this point she has some tenderness on palpation medial band of the plantar fascia.  Assessment: Mild tenderness on a surgical foot.  Plan: At this point requesting physical therapy follow-up with her after is complete.

## 2017-12-25 DIAGNOSIS — M79672 Pain in left foot: Secondary | ICD-10-CM | POA: Diagnosis not present

## 2017-12-27 DIAGNOSIS — M79672 Pain in left foot: Secondary | ICD-10-CM | POA: Diagnosis not present

## 2018-01-01 DIAGNOSIS — M79672 Pain in left foot: Secondary | ICD-10-CM | POA: Diagnosis not present

## 2018-01-10 DIAGNOSIS — M79672 Pain in left foot: Secondary | ICD-10-CM | POA: Diagnosis not present

## 2018-01-15 ENCOUNTER — Encounter: Payer: BLUE CROSS/BLUE SHIELD | Admitting: Podiatry

## 2018-01-15 DIAGNOSIS — M79672 Pain in left foot: Secondary | ICD-10-CM | POA: Diagnosis not present

## 2018-01-17 DIAGNOSIS — M79672 Pain in left foot: Secondary | ICD-10-CM | POA: Diagnosis not present

## 2018-01-22 ENCOUNTER — Encounter: Payer: Self-pay | Admitting: Podiatry

## 2018-01-22 ENCOUNTER — Ambulatory Visit (INDEPENDENT_AMBULATORY_CARE_PROVIDER_SITE_OTHER): Payer: BLUE CROSS/BLUE SHIELD | Admitting: Podiatry

## 2018-01-22 ENCOUNTER — Ambulatory Visit (INDEPENDENT_AMBULATORY_CARE_PROVIDER_SITE_OTHER): Payer: BLUE CROSS/BLUE SHIELD

## 2018-01-22 DIAGNOSIS — Z9889 Other specified postprocedural states: Secondary | ICD-10-CM | POA: Diagnosis not present

## 2018-01-22 DIAGNOSIS — M2012 Hallux valgus (acquired), left foot: Secondary | ICD-10-CM

## 2018-01-22 DIAGNOSIS — M722 Plantar fascial fibromatosis: Secondary | ICD-10-CM | POA: Diagnosis not present

## 2018-01-22 MED ORDER — CELECOXIB 200 MG PO CAPS: 200.0000 mg | ORAL_CAPSULE | Freq: Two times a day (BID) | ORAL | 3 refills | Status: DC

## 2018-01-22 NOTE — Progress Notes (Signed)
She presents today for follow-up of her surgical foot left she is status post Austin bunionectomy and endoscopic plantar fasciotomy of her left foot.  She is currently undergoing physical therapy for range of motion of the first metatarsophalangeal joint for ankle pain.  She states that the lateral aspect of her left foot is killing her the rest of the foot seems to be doing much better.  Date of surgery was October 18, 2017.  Objective: Vital signs are stable she is alert and oriented x3 moderate to severe pain on palpation medial calcaneal tubercle of the right heel minimal so on the left however she does have pain on palpation of lateral aspect of the left foot along the fifth ray.  Assessment: Letter fasciitis new right.  Lateral compensatory syndrome chronic left.  Plan: Started her on Celebrex 200 mg 1 p.o. daily I have also injected the right foot today sterile Betadine skin prep 20 mg Kenalog 5 mg Marcaine.  Encouraged her to tell physical therapy to really work on her left heel to help break up scar tissue there to prevent further damage from lateral compensation.

## 2018-01-24 DIAGNOSIS — M79672 Pain in left foot: Secondary | ICD-10-CM | POA: Diagnosis not present

## 2018-01-29 DIAGNOSIS — M79672 Pain in left foot: Secondary | ICD-10-CM | POA: Diagnosis not present

## 2018-01-31 DIAGNOSIS — M79672 Pain in left foot: Secondary | ICD-10-CM | POA: Diagnosis not present

## 2018-02-19 ENCOUNTER — Encounter: Payer: BLUE CROSS/BLUE SHIELD | Admitting: Podiatry

## 2018-03-17 ENCOUNTER — Telehealth: Payer: Self-pay

## 2018-03-17 NOTE — Telephone Encounter (Signed)
Pt aware she has not got her mammo done yet. I reminded her it is very important, pt aware

## 2018-03-17 NOTE — Telephone Encounter (Signed)
-----   Message from Nadara Mustardobert P Harris, MD sent at 03/17/2018 10:16 AM EDT ----- Regarding: still no MMG Received notice she has not received MMG yet as ordered at her Annual 6 months ago... Please check and encourage her to do this, and document conversation.

## 2018-04-28 ENCOUNTER — Telehealth: Payer: Self-pay

## 2018-04-28 NOTE — Telephone Encounter (Signed)
Patient called and said that she has decided that she would like to have the Cologuard testing done.

## 2018-04-29 NOTE — Telephone Encounter (Signed)
Order form faxed to [employer] for [employer].

## 2018-05-22 DIAGNOSIS — Z1211 Encounter for screening for malignant neoplasm of colon: Secondary | ICD-10-CM | POA: Diagnosis not present

## 2018-05-22 DIAGNOSIS — Z1212 Encounter for screening for malignant neoplasm of rectum: Secondary | ICD-10-CM | POA: Diagnosis not present

## 2018-05-27 LAB — COLOGUARD

## 2018-05-28 ENCOUNTER — Telehealth: Payer: Self-pay | Admitting: *Deleted

## 2018-05-28 ENCOUNTER — Encounter: Payer: Self-pay | Admitting: General Surgery

## 2018-05-28 NOTE — Telephone Encounter (Signed)
-----   Message from Earline MayotteJeffrey W Byrnett, MD sent at 05/28/2018  9:41 AM EST ----- Please notify the patient her Cologuard test was negative. She should plan on a repeat exam in three years. This can be scheduled by her PCP. Thanks.  ----- Message ----- From: Erich MontaneHarris, Angelynn Lemus C, CMA Sent: 05/28/2018   8:12 AM EST To: Earline MayotteJeffrey W Byrnett, MD

## 2018-05-28 NOTE — Telephone Encounter (Signed)
Notified patient as instructed, patient agrees.  

## 2018-08-15 DIAGNOSIS — D2261 Melanocytic nevi of right upper limb, including shoulder: Secondary | ICD-10-CM | POA: Diagnosis not present

## 2018-08-15 DIAGNOSIS — D225 Melanocytic nevi of trunk: Secondary | ICD-10-CM | POA: Diagnosis not present

## 2018-08-15 DIAGNOSIS — D2262 Melanocytic nevi of left upper limb, including shoulder: Secondary | ICD-10-CM | POA: Diagnosis not present

## 2018-08-15 DIAGNOSIS — D2272 Melanocytic nevi of left lower limb, including hip: Secondary | ICD-10-CM | POA: Diagnosis not present

## 2018-09-11 ENCOUNTER — Ambulatory Visit (INDEPENDENT_AMBULATORY_CARE_PROVIDER_SITE_OTHER): Payer: BLUE CROSS/BLUE SHIELD | Admitting: Obstetrics & Gynecology

## 2018-09-11 ENCOUNTER — Encounter: Payer: Self-pay | Admitting: Obstetrics & Gynecology

## 2018-09-11 ENCOUNTER — Other Ambulatory Visit: Payer: Self-pay

## 2018-09-11 VITALS — BP 120/80 | Ht 63.5 in | Wt 161.0 lb

## 2018-09-11 DIAGNOSIS — Z01419 Encounter for gynecological examination (general) (routine) without abnormal findings: Secondary | ICD-10-CM

## 2018-09-11 DIAGNOSIS — Z131 Encounter for screening for diabetes mellitus: Secondary | ICD-10-CM

## 2018-09-11 DIAGNOSIS — Z0000 Encounter for general adult medical examination without abnormal findings: Secondary | ICD-10-CM

## 2018-09-11 DIAGNOSIS — N959 Unspecified menopausal and perimenopausal disorder: Secondary | ICD-10-CM

## 2018-09-11 DIAGNOSIS — E7800 Pure hypercholesterolemia, unspecified: Secondary | ICD-10-CM

## 2018-09-11 DIAGNOSIS — Z1239 Encounter for other screening for malignant neoplasm of breast: Secondary | ICD-10-CM

## 2018-09-11 MED ORDER — CLOTRIMAZOLE-BETAMETHASONE 1-0.05 % EX CREA: 1.0000 "application " | TOPICAL_CREAM | Freq: Two times a day (BID) | CUTANEOUS | 0 refills | Status: DC

## 2018-09-11 MED ORDER — ESTROGENS CONJUGATED 1.25 MG PO TABS: 1.2500 mg | ORAL_TABLET | Freq: Every day | ORAL | 11 refills | Status: DC

## 2018-09-11 MED ORDER — VALACYCLOVIR HCL 500 MG PO TABS: 500.0000 mg | ORAL_TABLET | ORAL | 6 refills | Status: DC | PRN

## 2018-09-11 NOTE — Patient Instructions (Signed)
PAP every three years Mammogram every year    Call [phone] to schedule at [facility] Colonoscopy every 10 years or Cologuard every 3 Labs yearly (with PCP)

## 2018-09-11 NOTE — Progress Notes (Signed)
HPI:      Kelly Yang is a 53 y.o. [phone] who LMP was in past as she has had prior [facility], she presents today for her annual examination. The patient has no complaints today other than vulvar bumps intermittently with some skin irrritation; also continues w vaginal dryness and dyspareunia and low libido, also mood irritability.  Responded well w patch but had skin side effects; sis not respond well to generic estradiol pill.  Has been off for several mos now.  The patient is sexually active. Her last pap: approximate date 2019 and was normal and last mammogram: approximate date 2017 and was normal. The patient does perform self breast exams.  There is no notable family history of breast or ovarian cancer in her family.  The patient has regular exercise: yes.  The patient denies current symptoms of depression.    GYN History: Contraception: status post hysterectomy  PMHx: Past Medical History:  Diagnosis Date   Allergic rhinitis    rhinitis   Genital herpes    Hiatal hernia    Marijuana smoker    Past Surgical History:  Procedure Laterality Date   ABDOMINAL HYSTERECTOMY     partical   ANKLE SURGERY     bilateral spurs   BACK SURGERY  2016   GANGLION CYST EXCISION     right wrist   TONSILLECTOMY     TUBAL LIGATION     UPPER GI ENDOSCOPY  04/13/2011, 04-19-16   Dr Sheryn Bison, Dr Mechele Collin   Family History  Problem Relation Age of Onset   Hyperlipidemia Mother        51?   Breast cancer Mother    Other Mother        brain blockage-stent placement   Bladder Cancer Mother        14?   Lung cancer Father    COPD Father    Coronary artery disease Father 69       had CABG    Fibromyalgia Sister    Colon cancer Neg Hx    Social History   Tobacco Use   Smoking status: Never Smoker   Smokeless tobacco: Never Used   Tobacco comment: Uses marijuana,daily use for over 15 years , still uses regularly but not daily  Substance Use Topics    Alcohol use: Yes    Comment: occasional   Drug use: Yes    Types: Marijuana    Comment: marijuana    Current Outpatient Medications:    acetaminophen (TYLENOL) 500 MG tablet, Take 500 mg by mouth every 8 (eight) hours as needed., Disp: , Rfl:    celecoxib (CELEBREX) 200 MG capsule, Take 1 capsule (200 mg total) by mouth 2 (two) times daily., Disp: 30 capsule, Rfl: 3   Cetirizine HCl 10 MG CAPS, Take by mouth., Disp: , Rfl:    Cholecalciferol (VITAMIN D3) 1000 units CAPS, Take by mouth daily., Disp: , Rfl:    clotrimazole-betamethasone (LOTRISONE) cream, Apply 1 application topically 2 (two) times daily., Disp: 30 g, Rfl: 0   estrogens, conjugated, (PREMARIN) 1.25 MG tablet, Take 1 tablet (1.25 mg total) by mouth daily., Disp: 30 tablet, Rfl: 11   Multiple Vitamin (MULTIVITAMIN) tablet, Take 1 tablet by mouth daily., Disp: , Rfl:    naproxen sodium (ALEVE) 220 MG tablet, Take 220 mg by mouth 2 (two) times daily as needed., Disp: , Rfl:    omeprazole (PRILOSEC) 20 MG capsule, Take 20 mg by mouth daily., Disp: , Rfl:  ondansetron (ZOFRAN) 4 MG tablet, Take 1 tablet (4 mg total) by mouth every 8 (eight) hours as needed for nausea or vomiting., Disp: 20 tablet, Rfl: 0   Simethicone (GAS RELIEF 80 PO), Take 1 capsule by mouth daily., Disp: , Rfl:    SUMAtriptan (IMITREX) 20 MG/ACT nasal spray, Place 1 spray (20 mg total) into the nose every 2 (two) hours as needed for migraine., Disp: 1 Inhaler, Rfl: 5   valACYclovir (VALTREX) 500 MG tablet, Take 1 tablet (500 mg total) by mouth as needed., Disp: 30 tablet, Rfl: 6 Allergies: Codeine  Review of Systems  Constitutional: Negative for chills, fever and malaise/fatigue.  HENT: Negative for congestion, sinus pain and sore throat.   Eyes: Negative for blurred vision and pain.  Respiratory: Negative for cough and wheezing.   Cardiovascular: Negative for chest pain and leg swelling.  Gastrointestinal: Negative for abdominal pain,  constipation, diarrhea, heartburn, nausea and vomiting.  Genitourinary: Negative for dysuria, frequency, hematuria and urgency.  Musculoskeletal: Negative for back pain, joint pain, myalgias and neck pain.  Skin: Negative for itching and rash.  Neurological: Negative for dizziness, tremors and weakness.  Endo/Heme/Allergies: Does not bruise/bleed easily.  Psychiatric/Behavioral: Negative for depression. The patient is not nervous/anxious and does not have insomnia.    Objective: BP 120/80    Ht 5' 3.5" (1.613 m)    Wt 161 lb (73 kg)    BMI 28.07 kg/m   Filed Weights   09/11/18 0829  Weight: 161 lb (73 kg)   Body mass index is 28.07 kg/m. Physical Exam Constitutional:      General: She is not in acute distress.    Appearance: She is well-developed.  Genitourinary:     Pelvic exam was performed with patient supine.     Vagina and rectum normal.     No lesions in the vagina.     No vaginal bleeding.     No cervical motion tenderness, friability, lesion or polyp.     Uterus is absent.     No right or left adnexal mass present.     Right adnexa not tender.     Left adnexa not tender.     Genitourinary Comments: Small cyst right vulva 3 mm General irritation to skin of labia minora  HENT:     Head: Normocephalic and atraumatic. No laceration.     Right Ear: Hearing normal.     Left Ear: Hearing normal.     Mouth/Throat:     Pharynx: Uvula midline.  Eyes:     Pupils: Pupils are equal, round, and reactive to light.  Neck:     Musculoskeletal: Normal range of motion and neck supple.     Thyroid: No thyromegaly.  Cardiovascular:     Rate and Rhythm: Normal rate and regular rhythm.     Heart sounds: No murmur. No friction rub. No gallop.   Pulmonary:     Effort: Pulmonary effort is normal. No respiratory distress.     Breath sounds: Normal breath sounds. No wheezing.  Chest:     Breasts:        Right: No mass, skin change or tenderness.        Left: No mass, skin change or  tenderness.  Abdominal:     General: Bowel sounds are normal. There is no distension.     Palpations: Abdomen is soft.     Tenderness: There is no abdominal tenderness. There is no rebound.  Musculoskeletal: Normal range of motion.  Neurological:     Mental Status: She is alert and oriented to person, place, and time.     Cranial Nerves: No cranial nerve deficit.  Skin:    General: Skin is warm and dry.  Psychiatric:        Judgment: Judgment normal.  Vitals signs reviewed.   Assessment:   1. Hypercholesteremia   2. Annual physical exam   3. Screening for breast cancer   4. Menopausal and postmenopausal disorder   5. Screening for diabetes mellitus    Screening Plan:            1.  Cervical Screening-  Pap smear schedule reviewed with patient  2. Breast screening- Exam annually and mammogram>40 planned   3. Cologuard done 05/2018, every three years needed, Hemoccult testing - after age 16  4. Labs Ordered today  High cholesterol in past (249 last year) Has been on Krill oil supplement; consider other intervention  5. Vulvar rash/irritation Lotrisone  6. Counseling for hormones: Menopause, restart Premarin 1.25 mg daily DAW and see if helps  Consider Osphena, other Use express scripts once medicine found to work  HRT I have discussed HRT with the patient in detail.  The risk/benefits of it were reviewed.  She understands that during menopause Estrogen decreases dramatically and that this results in an increased risk of cardiovascular disease as well as osteoporosis.  We have also discussed the fact that hot flashes often result from a decrease in Estrogen, and that by replacing Estrogen, they can often be alleviated.  We have discussed skin, vaginal and urinary tract changes that may also take place from this drop in Estrogen.  Emotional changes have also been linked to Estrogen and we have briefly discussed this.  The benefits of HRT including decrease in hot flashes,  vaginal dryness, and osteoporosis were discussed.  The emotional benefit and a possible change in her cardiovascular risk profile was also reviewed.  The risks associated with Hormone Replacement Therapy were also reviewed.  The use of unopposed Estrogen and its relationship to endometrial cancer was discussed.  The addition of Progesterone and its beneficial effect on endometrial cancer was also noted.  The fact that there has been no consistent definitive studies showing an increase in breast cancer in women who use HRT was discussed with the patient.  The possible side effects including breast tenderness, fluid retention, mood changes and vaginal bleeding were discussed.  The patient was informed that this is an elective medication and that she may choose not to take Hormone Replacement Therapy.  Literature on HRT was given, and I believe that after answering all of the patients questions, she has an adequate and informed understanding of HRT.  Special emphasis on the WHI study, as well as several studies since that pertaining to the risks and benefits of estrogen replacement therapy were compared.  The possible limitations of these studies were discussed including the age stratification of the WHI study.  The possible role of Progesterone in these studies was discussed in detail.  I believe that the patient has an informed knowledge of the risks and benefits of HRT.  I have specifically discussed WHI findings and current updates.  Different type of hormone formulation and methods of taking hormone replacement therapy discussed.     F/U  Return in about 1 year (around 09/11/2019) for Annual.  Annamarie Major, MD, [facility], [facility] 09/11/2018  1:44 PM

## 2018-09-12 LAB — GLUCOSE, FASTING: GLUCOSE, PLASMA: 87 mg/dL (ref 65–99)

## 2018-09-12 LAB — LIPID PANEL
Chol/HDL Ratio: 4.8 ratio — ABNORMAL HIGH (ref 0.0–4.4)
Cholesterol, Total: 252 mg/dL — ABNORMAL HIGH (ref 100–199)
HDL: 53 mg/dL (ref >39)
LDL Calculated: 171 mg/dL — ABNORMAL HIGH (ref 0–99)
Triglycerides: 140 mg/dL (ref 0–149)
VLDL Cholesterol Cal: 28 mg/dL (ref 5–40)

## 2018-10-20 DIAGNOSIS — M5386 Other specified dorsopathies, lumbar region: Secondary | ICD-10-CM | POA: Diagnosis not present

## 2018-10-20 DIAGNOSIS — M9903 Segmental and somatic dysfunction of lumbar region: Secondary | ICD-10-CM | POA: Diagnosis not present

## 2018-12-22 ENCOUNTER — Other Ambulatory Visit: Payer: Self-pay

## 2018-12-22 ENCOUNTER — Encounter: Payer: Self-pay | Admitting: Podiatry

## 2018-12-22 ENCOUNTER — Ambulatory Visit: Payer: BLUE CROSS/BLUE SHIELD | Admitting: Podiatry

## 2018-12-22 VITALS — Temp 97.6°F

## 2018-12-22 DIAGNOSIS — M7752 Other enthesopathy of left foot: Secondary | ICD-10-CM

## 2018-12-22 DIAGNOSIS — M722 Plantar fascial fibromatosis: Secondary | ICD-10-CM | POA: Diagnosis not present

## 2018-12-22 MED ORDER — METHYLPREDNISOLONE 4 MG PO TBPK: ORAL_TABLET | ORAL | 0 refills | Status: DC

## 2018-12-22 NOTE — Progress Notes (Signed)
She presents today for follow-up of her plantar fasciitis bilaterally.  States that her left ankle is hurting.  States that been doing a lot of walking and getting a lot of pain in his left foot.  She states that her calf is tight and she gets a numb and sometimes burning sensation in her left calf but does not extend to her foot.  She does relate history of back surgery.  She states that her right foot is doing better since she returned from the beach.  Has not been wearing her orthotics.  States her orthotics are old.  Objective: Vital signs are stable she alert oriented x3.  Pulses are palpable.  She has pain on palpation range of motion of the left ankle anterior lateral border.  She has some tenderness on palpation medial [place]ok[place] tubercle of the left heel right foot does demonstrate mild edema pain on palpation medial [place]okato tubercle right heel.  Assessment: Plantar fasciitis right.  Resolving plan fasciitis left anterior ankle pain left.  Plan: Discussed etiology pathology conservative versus surgical therapies.  At this point I injected her left anterior ankle 20 mg Kenalog 5 mg Marcaine.  I injected her right heel with 20 mg Kenalog 5 mg Marcaine point maximal tenderness at the level of the plantar fascial insertion.  She will also continue her relief.  We will see by getting her a new pair of orthotics made.

## 2018-12-24 ENCOUNTER — Other Ambulatory Visit: Payer: BC Managed Care – PPO | Admitting: Orthotics

## 2018-12-31 ENCOUNTER — Ambulatory Visit (INDEPENDENT_AMBULATORY_CARE_PROVIDER_SITE_OTHER): Payer: BC Managed Care – PPO | Admitting: Orthotics

## 2018-12-31 ENCOUNTER — Other Ambulatory Visit: Payer: Self-pay

## 2018-12-31 DIAGNOSIS — M722 Plantar fascial fibromatosis: Secondary | ICD-10-CM | POA: Diagnosis not present

## 2018-12-31 DIAGNOSIS — M7752 Other enthesopathy of left foot: Secondary | ICD-10-CM

## 2018-12-31 DIAGNOSIS — M6788 Other specified disorders of synovium and tendon, other site: Secondary | ICD-10-CM

## 2018-12-31 NOTE — Progress Notes (Signed)

## 2019-01-05 DIAGNOSIS — M9902 Segmental and somatic dysfunction of thoracic region: Secondary | ICD-10-CM | POA: Diagnosis not present

## 2019-01-05 DIAGNOSIS — M25562 Pain in left knee: Secondary | ICD-10-CM | POA: Diagnosis not present

## 2019-01-05 DIAGNOSIS — M5386 Other specified dorsopathies, lumbar region: Secondary | ICD-10-CM | POA: Diagnosis not present

## 2019-01-05 DIAGNOSIS — M531 Cervicobrachial syndrome: Secondary | ICD-10-CM | POA: Diagnosis not present

## 2019-01-09 DIAGNOSIS — M25562 Pain in left knee: Secondary | ICD-10-CM | POA: Diagnosis not present

## 2019-01-09 DIAGNOSIS — M9902 Segmental and somatic dysfunction of thoracic region: Secondary | ICD-10-CM | POA: Diagnosis not present

## 2019-01-09 DIAGNOSIS — M5386 Other specified dorsopathies, lumbar region: Secondary | ICD-10-CM | POA: Diagnosis not present

## 2019-01-09 DIAGNOSIS — M531 Cervicobrachial syndrome: Secondary | ICD-10-CM | POA: Diagnosis not present

## 2019-01-21 ENCOUNTER — Other Ambulatory Visit: Payer: Self-pay

## 2019-01-21 ENCOUNTER — Ambulatory Visit: Payer: BC Managed Care – PPO | Admitting: Orthotics

## 2019-01-21 DIAGNOSIS — M722 Plantar fascial fibromatosis: Secondary | ICD-10-CM

## 2019-01-21 DIAGNOSIS — M6788 Other specified disorders of synovium and tendon, other site: Secondary | ICD-10-CM

## 2019-01-29 NOTE — Progress Notes (Signed)
Patient came in today to pick up custom made foot orthotics.  The goals were accomplished and the patient reported no dissatisfaction with said orthotics.  Patient was advised of breakin period and how to report any issues. 

## 2019-02-02 ENCOUNTER — Ambulatory Visit: Payer: BC Managed Care – PPO | Admitting: Podiatry

## 2019-04-08 DIAGNOSIS — J069 Acute upper respiratory infection, unspecified: Secondary | ICD-10-CM | POA: Diagnosis not present

## 2019-04-08 DIAGNOSIS — Z20828 Contact with and (suspected) exposure to other viral communicable diseases: Secondary | ICD-10-CM | POA: Diagnosis not present

## 2019-04-09 DIAGNOSIS — Z20828 Contact with and (suspected) exposure to other viral communicable diseases: Secondary | ICD-10-CM | POA: Diagnosis not present

## 2019-04-09 DIAGNOSIS — J069 Acute upper respiratory infection, unspecified: Secondary | ICD-10-CM | POA: Diagnosis not present

## 2019-04-29 ENCOUNTER — Telehealth: Payer: Self-pay

## 2019-04-29 ENCOUNTER — Other Ambulatory Visit: Payer: Self-pay

## 2019-04-29 DIAGNOSIS — B349 Viral infection, unspecified: Secondary | ICD-10-CM | POA: Diagnosis not present

## 2019-04-30 DIAGNOSIS — B349 Viral infection, unspecified: Secondary | ICD-10-CM | POA: Diagnosis not present

## 2019-05-01 DIAGNOSIS — B349 Viral infection, unspecified: Secondary | ICD-10-CM | POA: Diagnosis not present

## 2019-08-11 DIAGNOSIS — D2272 Melanocytic nevi of left lower limb, including hip: Secondary | ICD-10-CM | POA: Diagnosis not present

## 2019-08-11 DIAGNOSIS — D2262 Melanocytic nevi of left upper limb, including shoulder: Secondary | ICD-10-CM | POA: Diagnosis not present

## 2019-08-11 DIAGNOSIS — D2261 Melanocytic nevi of right upper limb, including shoulder: Secondary | ICD-10-CM | POA: Diagnosis not present

## 2019-08-11 DIAGNOSIS — D485 Neoplasm of uncertain behavior of skin: Secondary | ICD-10-CM | POA: Diagnosis not present

## 2019-08-11 DIAGNOSIS — D225 Melanocytic nevi of trunk: Secondary | ICD-10-CM | POA: Diagnosis not present

## 2019-08-12 DIAGNOSIS — L439 Lichen planus, unspecified: Secondary | ICD-10-CM | POA: Diagnosis not present

## 2019-09-14 ENCOUNTER — Ambulatory Visit (INDEPENDENT_AMBULATORY_CARE_PROVIDER_SITE_OTHER): Payer: BC Managed Care – PPO | Admitting: Obstetrics & Gynecology

## 2019-09-14 ENCOUNTER — Encounter: Payer: Self-pay | Admitting: Obstetrics & Gynecology

## 2019-09-14 ENCOUNTER — Other Ambulatory Visit: Payer: Self-pay

## 2019-09-14 VITALS — BP 120/80 | Ht 63.5 in | Wt 156.0 lb

## 2019-09-14 DIAGNOSIS — E7800 Pure hypercholesterolemia, unspecified: Secondary | ICD-10-CM

## 2019-09-14 DIAGNOSIS — Z90711 Acquired absence of uterus with remaining cervical stump: Secondary | ICD-10-CM | POA: Diagnosis not present

## 2019-09-14 DIAGNOSIS — Z01419 Encounter for gynecological examination (general) (routine) without abnormal findings: Secondary | ICD-10-CM | POA: Diagnosis not present

## 2019-09-14 DIAGNOSIS — Z1211 Encounter for screening for malignant neoplasm of colon: Secondary | ICD-10-CM

## 2019-09-14 DIAGNOSIS — Z1231 Encounter for screening mammogram for malignant neoplasm of breast: Secondary | ICD-10-CM

## 2019-09-14 MED ORDER — PREMARIN 1.25 MG PO TABS: 1.2500 mg | ORAL_TABLET | Freq: Every day | ORAL | 3 refills | Status: DC

## 2019-09-14 NOTE — Patient Instructions (Signed)
PAP every three years Mammogram every year    Call [phone] to schedule at [facility] every 3 years    Stool cards yearly Labs soon (cholesterol)  Replens for Vaginal Dryness

## 2019-09-14 NOTE — Progress Notes (Signed)
HPI:      [name] is a 54 y.o. [phone] who LMP was in the past, she presents today for her annual examination.  The patient has no complaints today; occas vag dryness.  Notices hot flashes, mood changes, and headaches when does not take ERT.  The patient is sexually active. Herlast pap: approximate date 2019 and was normal and last mammogram: several years ago.  The patient does perform self breast exams.  There is no notable family history of breast or ovarian cancer in her family. The patient is taking hormone replacement therapy. Patient denies post-menopausal vaginal bleeding.   The patient has regular exercise: yes. The patient denies current symptoms of depression.    GYN Hx: Last Colonoscopy:Cologuard 2019 ago. Normal.   PMHx: Past Medical History:  Diagnosis Date  . Allergic rhinitis    rhinitis  . Genital herpes   . Hiatal hernia   . Marijuana smoker    Past Surgical History:  Procedure Laterality Date  . ABDOMINAL HYSTERECTOMY     partical  . ANKLE SURGERY     bilateral spurs  . BACK SURGERY  2016  . GANGLION CYST EXCISION     right wrist  . TONSILLECTOMY    . TUBAL LIGATION    . UPPER GI ENDOSCOPY  04/13/2011, 04-19-16   Dr Sheryn Bison, Dr Mechele Collin   Family History  Problem Relation Age of Onset  . Hyperlipidemia Mother        14?  Marland Kitchen Breast cancer Mother   . Other Mother        brain blockage-stent placement  . Bladder Cancer Mother        56?  Marland Kitchen Lung cancer Father   . COPD Father   . Coronary artery disease Father 28       had CABG   . Fibromyalgia Sister   . Colon cancer Neg Hx    Social History   Tobacco Use  . Smoking status: Never Smoker  . Smokeless tobacco: Never Used  . Tobacco comment: Uses marijuana,daily use for over 15 years , still uses regularly but not daily  Substance Use Topics  . Alcohol use: Yes    Comment: occasional  . Drug use: Yes    Types: Marijuana    Comment: marijuana    Current Outpatient Medications:  .   acetaminophen (TYLENOL) 500 MG tablet, Take 500 mg by mouth every 8 (eight) hours as needed., Disp: , Rfl:  .  Cholecalciferol (VITAMIN D3) 1000 units CAPS, Take by mouth daily., Disp: , Rfl:  .  KRILL OIL PO, Take by mouth., Disp: , Rfl:  .  methylPREDNISolone (MEDROL DOSEPAK) 4 MG TBPK tablet, 6 day dose pack - take as directed, Disp: 21 tablet, Rfl: 0 .  NIACIN PO, Take by mouth., Disp: , Rfl:  .  PREMARIN 1.25 MG tablet, Take 1 tablet (1.25 mg total) by mouth daily., Disp: 90 tablet, Rfl: 3 Allergies: Codeine  Review of Systems  Constitutional: Negative for chills, fever and malaise/fatigue.  HENT: Negative for congestion, sinus pain and sore throat.   Eyes: Negative for blurred vision and pain.  Respiratory: Negative for cough and wheezing.   Cardiovascular: Negative for chest pain and leg swelling.  Gastrointestinal: Negative for abdominal pain, constipation, diarrhea, heartburn, nausea and vomiting.  Genitourinary: Negative for dysuria, frequency, hematuria and urgency.  Musculoskeletal: Negative for back pain, joint pain, myalgias and neck pain.  Skin: Negative for itching and rash.  Neurological: Negative for  dizziness, tremors and weakness.  Endo/Heme/Allergies: Does not bruise/bleed easily.  Psychiatric/Behavioral: Negative for depression. The patient is not nervous/anxious and does not have insomnia.     Objective: BP 120/80   Ht 5' 3.5" (1.613 m)   Wt 156 lb (70.8 kg)   BMI 27.20 kg/m   Filed Weights   09/14/19 0827  Weight: 156 lb (70.8 kg)   Body mass index is 27.2 kg/m. Physical Exam Constitutional:      General: She is not in acute distress.    Appearance: She is well-developed.  Genitourinary:     Pelvic exam was performed with patient supine.     Urethra, bladder, vagina, cervix and rectum normal.     No lesions in the vagina.     No vaginal bleeding.     Uterus is absent.     No right or left adnexal mass present.     Right adnexa not tender.      Left adnexa not tender.  HENT:     Head: Normocephalic and atraumatic. No laceration.     Right Ear: Hearing normal.     Left Ear: Hearing normal.     Mouth/Throat:     Pharynx: Uvula midline.  Eyes:     Pupils: Pupils are equal, round, and reactive to light.  Neck:     Thyroid: No thyromegaly.  Cardiovascular:     Rate and Rhythm: Normal rate and regular rhythm.     Heart sounds: No murmur. No friction rub. No gallop.   Pulmonary:     Effort: Pulmonary effort is normal. No respiratory distress.     Breath sounds: Normal breath sounds. No wheezing.  Chest:     Breasts:        Right: No mass, skin change or tenderness.        Left: No mass, skin change or tenderness.  Abdominal:     General: Bowel sounds are normal. There is no distension.     Palpations: Abdomen is soft.     Tenderness: There is no abdominal tenderness. There is no rebound.  Musculoskeletal:        General: Normal range of motion.     Cervical back: Normal range of motion and neck supple.  Neurological:     Mental Status: She is alert and oriented to person, place, and time.     Cranial Nerves: No cranial nerve deficit.  Skin:    General: Skin is warm and dry.  Psychiatric:        Judgment: Judgment normal.  Vitals reviewed.     Assessment: Annual Exam 1. Women's annual routine gynecological examination   2. Encounter for screening mammogram for malignant neoplasm of breast   3. Screen for colon cancer   4. Hypercholesteremia     Plan:            1.  Cervical Screening-  Pap smear schedule reviewed with patient, prior [place] so every 3 yrs  2. Breast screening- Exam annually and mammogram scheduled  3. Cologuard every 3 years, Hemoccult testing after age 49  4. Labs To return fasting at a later date FLP soon as has been high  5. Counseling for hormonal therapy: no change in therapy today Cont Premarin 1.25 mg daily currently Counseled as to future taper, discontinuation Replens for vag  dryness    F/U  Return in about 1 year (around 09/13/2020) for Annual.  Barnett Applebaum, MD, [facility], [facility] 09/14/2019  8:50 AM

## 2019-09-16 ENCOUNTER — Other Ambulatory Visit: Payer: Self-pay

## 2019-09-16 ENCOUNTER — Other Ambulatory Visit: Payer: BC Managed Care – PPO

## 2019-09-16 DIAGNOSIS — E7800 Pure hypercholesterolemia, unspecified: Secondary | ICD-10-CM | POA: Diagnosis not present

## 2019-09-17 LAB — LIPID PANEL
Chol/HDL Ratio: 4.1 ratio (ref 0.0–4.4)
Cholesterol, Total: 246 mg/dL — ABNORMAL HIGH (ref 100–199)
HDL: 60 mg/dL (ref >39)
LDL Chol Calc (NIH): 144 mg/dL — ABNORMAL HIGH (ref 0–99)
Triglycerides: 236 mg/dL — ABNORMAL HIGH (ref 0–149)
VLDL Cholesterol Cal: 42 mg/dL — ABNORMAL HIGH (ref 5–40)

## 2019-10-09 LAB — FECAL OCCULT BLOOD, IMMUNOCHEMICAL

## 2019-11-19 ENCOUNTER — Other Ambulatory Visit: Payer: Self-pay | Admitting: Obstetrics & Gynecology

## 2019-11-19 MED ORDER — VALACYCLOVIR HCL 500 MG PO TABS: 500.0000 mg | ORAL_TABLET | Freq: Every day | ORAL | 11 refills | Status: AC

## 2019-12-16 ENCOUNTER — Telehealth: Payer: Self-pay

## 2019-12-16 NOTE — Telephone Encounter (Signed)
-----   Message from Nadara Mustard, MD sent at 12/10/2019 11:50 AM EDT ----- Regarding: MMG Received notice she has not received MMG yet as ordered at her Annual. Please check and encourage her to do this, and document conversation.

## 2019-12-16 NOTE — Telephone Encounter (Signed)
Left message to Advised pt to schedule mammogram

## 2019-12-29 ENCOUNTER — Other Ambulatory Visit: Payer: Self-pay

## 2019-12-29 ENCOUNTER — Ambulatory Visit (INDEPENDENT_AMBULATORY_CARE_PROVIDER_SITE_OTHER): Payer: BC Managed Care – PPO | Admitting: Podiatry

## 2019-12-29 DIAGNOSIS — M7752 Other enthesopathy of left foot: Secondary | ICD-10-CM

## 2019-12-29 DIAGNOSIS — S90212D Contusion of left great toe with damage to nail, subsequent encounter: Secondary | ICD-10-CM

## 2019-12-29 DIAGNOSIS — L603 Nail dystrophy: Secondary | ICD-10-CM | POA: Diagnosis not present

## 2019-12-29 DIAGNOSIS — S90219A Contusion of unspecified great toe with damage to nail, initial encounter: Secondary | ICD-10-CM

## 2019-12-30 ENCOUNTER — Encounter: Payer: Self-pay | Admitting: Podiatry

## 2019-12-30 NOTE — Progress Notes (Signed)
Subjective:  Patient ID: Kelly Yang, female    DOB: [birth year],  MRN: [id number]  Chief Complaint  Patient presents with  . Nail Problem    pt is here for the left great toenail possibly falling off    54 y.o. female presents with the above complaint.  Patient presents with a complaint of left great toenail that is falling off and is hanging on by a thread.  Patient states that she does not recall any kind of trauma or injury to the area however he started just lifting up from the proximal nail fold and has progressive gotten worse.  She states that is painful to touch.  She would like to have it removed.  Patient has tried Band-Aid but has not helped much.  She is worried that it might get pulled off when wearing socks and will cause her a lot of pain.  She also has secondary complaint of left lateral ankle pain that has been going on for quite some time.  Patient does not have any history of ankle sprains.  She states that it hurts to ambulate on.  She does not have any feeling of ankle giving out.  She denies any other acute complaints.  She would like to know if there is anything that could be done for this.  She has not seen anyone else prior to seeing me for the ankle.   Review of Systems: Negative except as noted in the HPI. Denies N/V/F/Ch.  Past Medical History:  Diagnosis Date  . Allergic rhinitis    rhinitis  . Genital herpes   . Hiatal hernia   . Marijuana smoker     Current Outpatient Medications:  .  acetaminophen (TYLENOL) 500 MG tablet, Take 500 mg by mouth every 8 (eight) hours as needed., Disp: , Rfl:  .  Cholecalciferol (VITAMIN D3) 1000 units CAPS, Take by mouth daily., Disp: , Rfl:  .  KRILL OIL PO, Take by mouth., Disp: , Rfl:  .  methylPREDNISolone (MEDROL DOSEPAK) 4 MG TBPK tablet, 6 day dose pack - take as directed, Disp: 21 tablet, Rfl: 0 .  NIACIN PO, Take by mouth., Disp: , Rfl:  .  PREMARIN 1.25 MG tablet, Take 1 tablet (1.25 mg total) by mouth daily.,  Disp: 90 tablet, Rfl: 3  Social History   Tobacco Use  Smoking Status Never Smoker  Smokeless Tobacco Never Used  Tobacco Comment   Uses marijuana,daily use for over 15 years , still uses regularly but not daily    Allergies  Allergen Reactions  . Codeine     Other reaction(s): Vomiting   Objective:  There were no vitals filed for this visit. There is no height or weight on file to calculate BMI. Constitutional Well developed. Well nourished.  Vascular Dorsalis pedis pulses palpable bilaterally. Posterior tibial pulses palpable bilaterally. Capillary refill normal to all digits.  No cyanosis or clubbing noted. Pedal hair growth normal.  Neurologic Normal speech. Oriented to person, place, and time. Epicritic sensation to light touch grossly present bilaterally.  Dermatologic Pain on palpation of the entire/total nail on 1st digit of the left No other open wounds. No skin lesions.  Orthopedic:  Pain on palpation to the left lateral ankle.  Pain at the ATFL ligament.  Pain with plantarflexion and inversion of the foot.  No pain with dorsiflexion eversion of the foot.  These findings were consistent with both active and passive range of motion.  No pain with dorsiflexion of the ankle.  No intra-articular ankle joint pain noted. No visible deformities. No bony tenderness.   Radiographs: None Assessment:   1. Contusion of great toe with damage to nail, initial encounter   2. Nail dystrophy   3. Capsulitis of ankle, left    Plan:  Patient was evaluated and treated and all questions answered.  Left lateral ankle capsulitis -I explained to the patient the etiology of capsulitis and various treatment options were discussed.  I believe that patient may have stressed the ATFL ligament over the last couple of months which may have lead to acute inflammation in the area.  Given that her pain is mild to moderate in nature I believe she will benefit from a steroid injection.  If there  is no resolve meant with steroid injection we will consider doing cam boot immobilization for now.  Patient agrees with the plan would like to proceed with a steroid injection. -A steroid injection was performed at left lateral ankle using 1% plain Lidocaine and 10 mg of Kenalog. This was well tolerated.   Nail contusion/dystrophy hallux, left -Patient elects to proceed with minor surgery to remove entire toenail today. Consent reviewed and signed by patient. -Entire/total nail excised. See procedure note. -Educated on post-procedure care including soaking. Written instructions provided and reviewed. -Patient to follow up in 2 weeks for nail check.  Procedure: Excision of entire/total nail  Location: Left 1st toe digit Anesthesia: Lidocaine 1% plain; 1.5 mL and Marcaine 0.5% plain; 1.5 mL, digital block. Skin Prep: Betadine. Dressing: Silvadene; telfa; dry, sterile, compression dressing. Technique: Following skin prep, the toe was exsanguinated and a tourniquet was secured at the base of the toe. The affected nail border was freed and excised. The tourniquet was then removed and sterile dressing applied. Disposition: Patient tolerated procedure well. Patient to return in 2 weeks for follow-up.   No follow-ups on file.

## 2020-04-05 DIAGNOSIS — G5602 Carpal tunnel syndrome, left upper limb: Secondary | ICD-10-CM | POA: Diagnosis not present

## 2020-04-12 ENCOUNTER — Other Ambulatory Visit: Payer: Self-pay | Admitting: Obstetrics & Gynecology

## 2020-04-12 MED ORDER — PREMARIN 1.25 MG PO TABS: 1.2500 mg | ORAL_TABLET | Freq: Every day | ORAL | 3 refills | Status: DC

## 2020-04-13 ENCOUNTER — Other Ambulatory Visit: Payer: Self-pay | Admitting: Obstetrics & Gynecology

## 2020-04-13 MED ORDER — ESTRADIOL 1 MG PO TABS: 1.0000 mg | ORAL_TABLET | Freq: Every day | ORAL | 3 refills | Status: DC

## 2020-06-21 ENCOUNTER — Other Ambulatory Visit: Payer: Self-pay

## 2020-06-21 ENCOUNTER — Ambulatory Visit
Admission: RE | Admit: 2020-06-21 | Discharge: 2020-06-21 | Disposition: A | Discharge status: Home / Self Care | Payer: BC Managed Care – PPO | Source: Ambulatory Visit | Attending: Obstetrics & Gynecology | Admitting: Obstetrics & Gynecology

## 2020-06-21 DIAGNOSIS — Z1231 Encounter for screening mammogram for malignant neoplasm of breast: Secondary | ICD-10-CM

## 2020-06-30 ENCOUNTER — Other Ambulatory Visit: Payer: Self-pay

## 2020-06-30 DIAGNOSIS — M9903 Segmental and somatic dysfunction of lumbar region: Secondary | ICD-10-CM | POA: Diagnosis not present

## 2020-06-30 DIAGNOSIS — M9902 Segmental and somatic dysfunction of thoracic region: Secondary | ICD-10-CM | POA: Diagnosis not present

## 2020-06-30 DIAGNOSIS — M9904 Segmental and somatic dysfunction of sacral region: Secondary | ICD-10-CM | POA: Diagnosis not present

## 2020-06-30 DIAGNOSIS — M5385 Other specified dorsopathies, thoracolumbar region: Secondary | ICD-10-CM | POA: Diagnosis not present

## 2020-07-04 ENCOUNTER — Ambulatory Visit: Payer: BC Managed Care – PPO | Admitting: Obstetrics & Gynecology

## 2020-07-06 DIAGNOSIS — M9903 Segmental and somatic dysfunction of lumbar region: Secondary | ICD-10-CM | POA: Diagnosis not present

## 2020-07-06 DIAGNOSIS — M4607 Spinal enthesopathy, lumbosacral region: Secondary | ICD-10-CM | POA: Diagnosis not present

## 2020-07-06 DIAGNOSIS — M9901 Segmental and somatic dysfunction of cervical region: Secondary | ICD-10-CM | POA: Diagnosis not present

## 2020-07-06 DIAGNOSIS — M9904 Segmental and somatic dysfunction of sacral region: Secondary | ICD-10-CM | POA: Diagnosis not present

## 2020-07-13 DIAGNOSIS — M9903 Segmental and somatic dysfunction of lumbar region: Secondary | ICD-10-CM | POA: Diagnosis not present

## 2020-07-13 DIAGNOSIS — M9904 Segmental and somatic dysfunction of sacral region: Secondary | ICD-10-CM | POA: Diagnosis not present

## 2020-07-13 DIAGNOSIS — M9901 Segmental and somatic dysfunction of cervical region: Secondary | ICD-10-CM | POA: Diagnosis not present

## 2020-07-13 DIAGNOSIS — M4607 Spinal enthesopathy, lumbosacral region: Secondary | ICD-10-CM | POA: Diagnosis not present

## 2020-07-20 ENCOUNTER — Other Ambulatory Visit: Payer: Self-pay

## 2020-07-20 DIAGNOSIS — M4607 Spinal enthesopathy, lumbosacral region: Secondary | ICD-10-CM | POA: Diagnosis not present

## 2020-07-20 DIAGNOSIS — M9903 Segmental and somatic dysfunction of lumbar region: Secondary | ICD-10-CM | POA: Diagnosis not present

## 2020-07-20 DIAGNOSIS — M9904 Segmental and somatic dysfunction of sacral region: Secondary | ICD-10-CM | POA: Diagnosis not present

## 2020-07-20 DIAGNOSIS — M9901 Segmental and somatic dysfunction of cervical region: Secondary | ICD-10-CM | POA: Diagnosis not present

## 2020-07-20 MED ORDER — ESTRADIOL 1 MG PO TABS: 1.0000 mg | ORAL_TABLET | Freq: Every day | ORAL | 3 refills | Status: DC

## 2020-08-09 DIAGNOSIS — G5602 Carpal tunnel syndrome, left upper limb: Secondary | ICD-10-CM | POA: Diagnosis not present

## 2020-08-10 DIAGNOSIS — M9904 Segmental and somatic dysfunction of sacral region: Secondary | ICD-10-CM | POA: Diagnosis not present

## 2020-08-10 DIAGNOSIS — M9903 Segmental and somatic dysfunction of lumbar region: Secondary | ICD-10-CM | POA: Diagnosis not present

## 2020-08-10 DIAGNOSIS — M5387 Other specified dorsopathies, lumbosacral region: Secondary | ICD-10-CM | POA: Diagnosis not present

## 2020-09-15 ENCOUNTER — Ambulatory Visit: Payer: BC Managed Care – PPO | Admitting: Obstetrics & Gynecology

## 2020-09-19 DIAGNOSIS — D2262 Melanocytic nevi of left upper limb, including shoulder: Secondary | ICD-10-CM | POA: Diagnosis not present

## 2020-09-19 DIAGNOSIS — D2261 Melanocytic nevi of right upper limb, including shoulder: Secondary | ICD-10-CM | POA: Diagnosis not present

## 2020-09-19 DIAGNOSIS — D225 Melanocytic nevi of trunk: Secondary | ICD-10-CM | POA: Diagnosis not present

## 2020-09-19 DIAGNOSIS — D2272 Melanocytic nevi of left lower limb, including hip: Secondary | ICD-10-CM | POA: Diagnosis not present

## 2020-10-06 DIAGNOSIS — R0781 Pleurodynia: Secondary | ICD-10-CM | POA: Diagnosis not present

## 2020-10-06 DIAGNOSIS — R0902 Hypoxemia: Secondary | ICD-10-CM | POA: Diagnosis not present

## 2020-10-06 DIAGNOSIS — R0789 Other chest pain: Secondary | ICD-10-CM | POA: Diagnosis not present

## 2020-10-06 DIAGNOSIS — I10 Essential (primary) hypertension: Secondary | ICD-10-CM | POA: Diagnosis not present

## 2020-10-06 DIAGNOSIS — R079 Chest pain, unspecified: Secondary | ICD-10-CM | POA: Diagnosis not present

## 2020-12-20 ENCOUNTER — Other Ambulatory Visit: Payer: Self-pay | Admitting: General Surgery

## 2020-12-20 DIAGNOSIS — R1011 Right upper quadrant pain: Secondary | ICD-10-CM

## 2021-01-12 ENCOUNTER — Ambulatory Visit: Payer: BC Managed Care – PPO

## 2021-02-02 ENCOUNTER — Ambulatory Visit
Admission: RE | Admit: 2021-02-02 | Discharge: 2021-02-02 | Disposition: A | Discharge status: Home / Self Care | Payer: BC Managed Care – PPO | Source: Ambulatory Visit | Attending: General Surgery | Admitting: General Surgery

## 2021-02-02 ENCOUNTER — Other Ambulatory Visit: Payer: Self-pay

## 2021-02-02 DIAGNOSIS — R1011 Right upper quadrant pain: Secondary | ICD-10-CM | POA: Diagnosis not present

## 2021-02-02 DIAGNOSIS — R1013 Epigastric pain: Secondary | ICD-10-CM | POA: Diagnosis not present

## 2021-03-13 ENCOUNTER — Ambulatory Visit
Admission: RE | Admit: 2021-03-13 | Discharge: 2021-03-13 | Disposition: A | Discharge status: Home / Self Care | Payer: BC Managed Care – PPO | Source: Ambulatory Visit | Attending: General Surgery | Admitting: General Surgery

## 2021-03-13 DIAGNOSIS — R1011 Right upper quadrant pain: Secondary | ICD-10-CM | POA: Insufficient documentation

## 2021-03-13 MED ORDER — TECHNETIUM TC 99M MEBROFENIN IV KIT
5.0000 | PACK | Freq: Once | INTRAVENOUS | Status: AC | PRN
  Administered 2021-03-13: 4.81 via INTRAVENOUS

## 2021-04-05 DIAGNOSIS — M461 Sacroiliitis, not elsewhere classified: Secondary | ICD-10-CM | POA: Diagnosis not present

## 2021-04-05 DIAGNOSIS — M9903 Segmental and somatic dysfunction of lumbar region: Secondary | ICD-10-CM | POA: Diagnosis not present

## 2021-04-05 DIAGNOSIS — M9904 Segmental and somatic dysfunction of sacral region: Secondary | ICD-10-CM | POA: Diagnosis not present

## 2021-04-10 DIAGNOSIS — M9904 Segmental and somatic dysfunction of sacral region: Secondary | ICD-10-CM | POA: Diagnosis not present

## 2021-04-10 DIAGNOSIS — M461 Sacroiliitis, not elsewhere classified: Secondary | ICD-10-CM | POA: Diagnosis not present

## 2021-04-10 DIAGNOSIS — M9901 Segmental and somatic dysfunction of cervical region: Secondary | ICD-10-CM | POA: Diagnosis not present

## 2021-04-10 DIAGNOSIS — M9903 Segmental and somatic dysfunction of lumbar region: Secondary | ICD-10-CM | POA: Diagnosis not present

## 2021-05-30 DIAGNOSIS — M9904 Segmental and somatic dysfunction of sacral region: Secondary | ICD-10-CM | POA: Diagnosis not present

## 2021-05-30 DIAGNOSIS — M9901 Segmental and somatic dysfunction of cervical region: Secondary | ICD-10-CM | POA: Diagnosis not present

## 2021-05-30 DIAGNOSIS — M461 Sacroiliitis, not elsewhere classified: Secondary | ICD-10-CM | POA: Diagnosis not present

## 2021-05-30 DIAGNOSIS — M9903 Segmental and somatic dysfunction of lumbar region: Secondary | ICD-10-CM | POA: Diagnosis not present

## 2021-06-01 DIAGNOSIS — M9901 Segmental and somatic dysfunction of cervical region: Secondary | ICD-10-CM | POA: Diagnosis not present

## 2021-06-01 DIAGNOSIS — M9903 Segmental and somatic dysfunction of lumbar region: Secondary | ICD-10-CM | POA: Diagnosis not present

## 2021-06-01 DIAGNOSIS — M9904 Segmental and somatic dysfunction of sacral region: Secondary | ICD-10-CM | POA: Diagnosis not present

## 2021-06-01 DIAGNOSIS — M461 Sacroiliitis, not elsewhere classified: Secondary | ICD-10-CM | POA: Diagnosis not present

## 2021-06-15 DIAGNOSIS — M9903 Segmental and somatic dysfunction of lumbar region: Secondary | ICD-10-CM | POA: Diagnosis not present

## 2021-06-15 DIAGNOSIS — M9901 Segmental and somatic dysfunction of cervical region: Secondary | ICD-10-CM | POA: Diagnosis not present

## 2021-06-15 DIAGNOSIS — M9904 Segmental and somatic dysfunction of sacral region: Secondary | ICD-10-CM | POA: Diagnosis not present

## 2021-06-15 DIAGNOSIS — M461 Sacroiliitis, not elsewhere classified: Secondary | ICD-10-CM | POA: Diagnosis not present

## 2021-06-21 ENCOUNTER — Ambulatory Visit (INDEPENDENT_AMBULATORY_CARE_PROVIDER_SITE_OTHER): Payer: BC Managed Care – PPO | Admitting: Obstetrics & Gynecology

## 2021-06-21 ENCOUNTER — Other Ambulatory Visit: Payer: Self-pay

## 2021-06-21 ENCOUNTER — Other Ambulatory Visit (HOSPITAL_COMMUNITY)
Admission: RE | Admit: 2021-06-21 | Discharge: 2021-06-21 | Disposition: A | Discharge status: Home / Self Care | Payer: BC Managed Care – PPO | Source: Ambulatory Visit | Attending: Obstetrics & Gynecology | Admitting: Obstetrics & Gynecology

## 2021-06-21 ENCOUNTER — Encounter: Payer: Self-pay | Admitting: Obstetrics & Gynecology

## 2021-06-21 VITALS — BP 126/84 | Ht 63.0 in | Wt 159.0 lb

## 2021-06-21 DIAGNOSIS — Z1231 Encounter for screening mammogram for malignant neoplasm of breast: Secondary | ICD-10-CM | POA: Diagnosis not present

## 2021-06-21 DIAGNOSIS — Z124 Encounter for screening for malignant neoplasm of cervix: Secondary | ICD-10-CM | POA: Insufficient documentation

## 2021-06-21 DIAGNOSIS — Z01419 Encounter for gynecological examination (general) (routine) without abnormal findings: Secondary | ICD-10-CM

## 2021-06-21 MED ORDER — ESTRADIOL 1 MG PO TABS: 1.0000 mg | ORAL_TABLET | Freq: Every day | ORAL | 3 refills | Status: DC

## 2021-06-21 MED ORDER — VALACYCLOVIR HCL 500 MG PO TABS: 500.0000 mg | ORAL_TABLET | Freq: Every day | ORAL | 11 refills | Status: AC | PRN

## 2021-06-21 NOTE — Progress Notes (Signed)
HPI:      [name] is a 55 y.o. [phone] who LMP was in the past, she presents today for her annual examination.  The patient has no complaints today. The patient is sexually active. Herlast pap: approximate date 2019 and was normal and last mammogram: approximate date 2021 and was normal.  The patient does perform self breast exams.  There is no notable family history of breast or ovarian cancer in her family. The patient is not currently taking hormone replacement therapy.  She stopped this past year, but has had hot flashes and vag dryness develop in severe way.    The patient has regular exercise: yes. The patient denies current symptoms of depression.    GYN Hx: Last Colonoscopy: [facility] 3 years  ago. Normal.  Last DEXA:  never  ago.    PMHx: Past Medical History:  Diagnosis Date   Allergic rhinitis    rhinitis   Genital herpes    Hiatal hernia    Marijuana smoker    Past Surgical History:  Procedure Laterality Date   ABDOMINAL HYSTERECTOMY     partical   ANKLE SURGERY     bilateral spurs   BACK SURGERY  2016   GANGLION CYST EXCISION     right wrist   TONSILLECTOMY     TUBAL LIGATION     UPPER GI ENDOSCOPY  04/13/2011, 04-19-16   Dr Verl Blalock, Dr Vira Agar   Family History  Problem Relation Age of Onset   Hyperlipidemia Mother        52?   Breast cancer Mother    Other Mother        brain blockage-stent placement   Bladder Cancer Mother        4?   Lung cancer Father    COPD Father    Coronary artery disease Father 36       had CABG    Fibromyalgia Sister    Colon cancer Neg Hx    Social History   Tobacco Use   Smoking status: Never   Smokeless tobacco: Never   Tobacco comments:    Uses marijuana,daily use for over 15 years , still uses regularly but not daily  Vaping Use   Vaping Use: Never used  Substance Use Topics   Alcohol use: Yes    Comment: occasional   Drug use: Yes    Types: Marijuana    Comment: marijuana    Current  Outpatient Medications:    acetaminophen (TYLENOL) 500 MG tablet, Take 500 mg by mouth every 8 (eight) hours as needed., Disp: , Rfl:    valACYclovir (VALTREX) 500 MG tablet, Take 1 tablet (500 mg total) by mouth daily as needed., Disp: 30 tablet, Rfl: 11   estradiol (ESTRACE) 1 MG tablet, Take 1 tablet (1 mg total) by mouth daily., Disp: 90 tablet, Rfl: 3 Allergies: Codeine  Review of Systems  Constitutional:  Negative for chills, fever and malaise/fatigue.  HENT:  Negative for congestion, sinus pain and sore throat.   Eyes:  Negative for blurred vision and pain.  Respiratory:  Negative for cough and wheezing.   Cardiovascular:  Negative for chest pain and leg swelling.  Gastrointestinal:  Negative for abdominal pain, constipation, diarrhea, heartburn, nausea and vomiting.  Genitourinary:  Negative for dysuria, frequency, hematuria and urgency.  Musculoskeletal:  Negative for back pain, joint pain, myalgias and neck pain.  Skin:  Negative for itching and rash.  Neurological:  Negative for dizziness, tremors and weakness.  Endo/Heme/Allergies:  Does not bruise/bleed easily.  Psychiatric/Behavioral:  Negative for depression. The patient is not nervous/anxious and does not have insomnia.    Objective: BP 126/84    Ht 5\' 3"  (1.6 m)    Wt 159 lb (72.1 kg)    BMI 28.17 kg/m   Filed Weights   06/21/21 1014  Weight: 159 lb (72.1 kg)   Body mass index is 28.17 kg/m. Physical Exam Constitutional:      General: She is not in acute distress.    Appearance: She is well-developed.  Genitourinary:     Bladder, rectum and urethral meatus normal.     No lesions in the vagina.     Right Labia: No rash, tenderness or lesions.    Left Labia: No tenderness, lesions or rash.    No vaginal bleeding.      Right Adnexa: not tender and no mass present.    Left Adnexa: not tender and no mass present.    No cervical motion tenderness, friability, lesion or polyp.     Uterus is absent.     Pelvic exam  was performed with patient in the lithotomy position.  Breasts:    Right: No mass, skin change or tenderness.     Left: No mass, skin change or tenderness.  HENT:     Head: Normocephalic and atraumatic. No laceration.     Right Ear: Hearing normal.     Left Ear: Hearing normal.     Mouth/Throat:     Pharynx: Uvula midline.  Eyes:     Pupils: Pupils are equal, round, and reactive to light.  Neck:     Thyroid: No thyromegaly.  Cardiovascular:     Rate and Rhythm: Normal rate and regular rhythm.     Heart sounds: No murmur heard.   No friction rub. No gallop.  Pulmonary:     Effort: Pulmonary effort is normal. No respiratory distress.     Breath sounds: Normal breath sounds. No wheezing.  Abdominal:     General: Bowel sounds are normal. There is no distension.     Palpations: Abdomen is soft.     Tenderness: There is no abdominal tenderness. There is no rebound.  Musculoskeletal:        General: Normal range of motion.     Cervical back: Normal range of motion and neck supple.  Neurological:     Mental Status: She is alert and oriented to person, place, and time.     Cranial Nerves: No cranial nerve deficit.  Skin:    General: Skin is warm and dry.  Psychiatric:        Judgment: Judgment normal.  Vitals reviewed.    Assessment: Annual Exam 1. Women's annual routine gynecological examination   2. Encounter for screening mammogram for malignant neoplasm of breast   3. Screening for cervical cancer     Plan:            1.  Cervical Screening-  Pap smear done today  2. Breast screening- Exam annually and mammogram scheduled  3. Colonoscopy every 10 years, Hemoccult testing after age 17  4. Labs managed by PCP  5. Counseling for hormonal therapy: wants to change HRT or dose due to hot flashes, vaginal dryness, and so wil restart ERT.  Offered alternative options after discussion, she prefers Estradiol 1 mg daily..              6. FRAX - FRAX score for assessing the 10  year  probability for fracture calculated and discussed today.  Based on age and score today, DEXA is not currently scheduled.    F/U  Return for and Annual when due.  Kelly Major, MD, [facility], [facility] 06/21/2021  10:51 AM

## 2021-06-21 NOTE — Patient Instructions (Signed)
PAP every three years Mammogram every year    Call [phone] to schedule at [place] Colonoscopy every 10 years Labs yearly (with PCP)  Thank you for choosing [facility]. As part of our ongoing efforts to improve patient experience, we would appreciate your feedback. Please fill out the short survey that you will receive by mail or MyChart. Your opinion is important to us! - Dr. Raysean Graumann  Recommendations to boost your immunity to prevent illness such as viral flu and colds, including covid19, are as follows:       - - -  Vitamin K2 and Vitamin D3  - - - Take Vitamin K2 at 200-300 mcg daily (usually 2-3 pills daily of the over the counter formulation). Take Vitamin D3 at 3000-4000 U daily (usually 3-4 pills daily of the over the counter formulation). Studies show that these two at high normal levels in your system are very effective in keeping your immunity so strong and protective that you will be unlikely to contract viral illness such as those listed above.  Dr Kyleeann Cremeans  

## 2021-06-22 DIAGNOSIS — Z1211 Encounter for screening for malignant neoplasm of colon: Secondary | ICD-10-CM | POA: Diagnosis not present

## 2021-06-27 LAB — CYTOLOGY - PAP
Comment: NEGATIVE
Diagnosis: NEGATIVE
High risk HPV: NEGATIVE

## 2021-06-29 ENCOUNTER — Other Ambulatory Visit: Payer: Self-pay

## 2021-06-29 ENCOUNTER — Ambulatory Visit
Admission: RE | Admit: 2021-06-29 | Discharge: 2021-06-29 | Disposition: A | Discharge status: Home / Self Care | Payer: BC Managed Care – PPO | Source: Ambulatory Visit | Attending: Obstetrics & Gynecology | Admitting: Obstetrics & Gynecology

## 2021-06-29 DIAGNOSIS — Z1231 Encounter for screening mammogram for malignant neoplasm of breast: Secondary | ICD-10-CM | POA: Insufficient documentation

## 2021-07-01 ENCOUNTER — Encounter: Payer: Self-pay | Admitting: Obstetrics & Gynecology

## 2021-07-04 DIAGNOSIS — Z1211 Encounter for screening for malignant neoplasm of colon: Secondary | ICD-10-CM | POA: Diagnosis not present

## 2021-07-05 ENCOUNTER — Other Ambulatory Visit: Payer: Self-pay

## 2021-07-05 DIAGNOSIS — Z23 Encounter for immunization: Secondary | ICD-10-CM | POA: Diagnosis not present

## 2021-07-05 MED ORDER — ESTRADIOL 1 MG PO TABS: 1.0000 mg | ORAL_TABLET | Freq: Every day | ORAL | 3 refills | Status: DC

## 2021-07-11 ENCOUNTER — Other Ambulatory Visit: Payer: Self-pay | Admitting: Obstetrics & Gynecology

## 2021-07-11 DIAGNOSIS — E7800 Pure hypercholesterolemia, unspecified: Secondary | ICD-10-CM

## 2021-08-02 ENCOUNTER — Other Ambulatory Visit: Payer: Self-pay

## 2021-08-02 ENCOUNTER — Other Ambulatory Visit: Payer: BC Managed Care – PPO

## 2021-08-02 DIAGNOSIS — E7800 Pure hypercholesterolemia, unspecified: Secondary | ICD-10-CM

## 2021-08-03 LAB — LIPID PANEL
Chol/HDL Ratio: 3.8 ratio (ref 0.0–4.4)
Cholesterol, Total: 240 mg/dL — ABNORMAL HIGH (ref 100–199)
HDL: 64 mg/dL (ref >39)
LDL Chol Calc (NIH): 157 mg/dL — ABNORMAL HIGH (ref 0–99)
Triglycerides: 109 mg/dL (ref 0–149)
VLDL Cholesterol Cal: 19 mg/dL (ref 5–40)

## 2021-08-07 ENCOUNTER — Encounter: Payer: Self-pay | Admitting: Obstetrics & Gynecology

## 2021-08-07 ENCOUNTER — Other Ambulatory Visit: Payer: Self-pay | Admitting: Obstetrics & Gynecology

## 2021-08-07 DIAGNOSIS — E7800 Pure hypercholesterolemia, unspecified: Secondary | ICD-10-CM

## 2021-08-07 MED ORDER — ROSUVASTATIN CALCIUM 10 MG PO TABS: 10.0000 mg | ORAL_TABLET | Freq: Every day | ORAL | 3 refills | Status: DC

## 2021-08-11 NOTE — Telephone Encounter (Signed)
Contacted patient via phone. Patient is aware of date/ location/ time

## 2021-09-21 ENCOUNTER — Ambulatory Visit: Payer: BC Managed Care – PPO | Admitting: Adult Health

## 2021-09-21 DIAGNOSIS — J0120 Acute ethmoidal sinusitis, unspecified: Secondary | ICD-10-CM | POA: Diagnosis not present

## 2021-09-28 ENCOUNTER — Other Ambulatory Visit: Payer: BC Managed Care – PPO

## 2021-09-28 DIAGNOSIS — E7800 Pure hypercholesterolemia, unspecified: Secondary | ICD-10-CM

## 2021-09-29 ENCOUNTER — Other Ambulatory Visit: Payer: BC Managed Care – PPO

## 2021-09-29 ENCOUNTER — Encounter: Payer: Self-pay | Admitting: Obstetrics & Gynecology

## 2021-09-29 LAB — LIPID PANEL
Chol/HDL Ratio: 2.6 ratio (ref 0.0–4.4)
Cholesterol, Total: 166 mg/dL (ref 100–199)
HDL: 63 mg/dL (ref >39)
LDL Chol Calc (NIH): 87 mg/dL (ref 0–99)
Triglycerides: 85 mg/dL (ref 0–149)
VLDL Cholesterol Cal: 16 mg/dL (ref 5–40)

## 2021-09-29 LAB — HEPATIC FUNCTION PANEL
ALT: 14 IU/L (ref 0–32)
AST: 20 IU/L (ref 0–40)
Albumin: 4.4 g/dL (ref 3.8–4.9)
Alkaline Phosphatase: 62 IU/L (ref 44–121)
Bilirubin Total: 0.2 mg/dL (ref 0.0–1.2)
Bilirubin, Direct: <0.1 mg/dL (ref 0.00–0.40)
Total Protein: 6.5 g/dL (ref 6.0–8.5)

## 2021-10-02 ENCOUNTER — Telehealth: Payer: Self-pay

## 2021-10-02 DIAGNOSIS — D2262 Melanocytic nevi of left upper limb, including shoulder: Secondary | ICD-10-CM | POA: Diagnosis not present

## 2021-10-02 DIAGNOSIS — J019 Acute sinusitis, unspecified: Secondary | ICD-10-CM | POA: Diagnosis not present

## 2021-10-02 DIAGNOSIS — D2272 Melanocytic nevi of left lower limb, including hip: Secondary | ICD-10-CM | POA: Diagnosis not present

## 2021-10-02 DIAGNOSIS — D2261 Melanocytic nevi of right upper limb, including shoulder: Secondary | ICD-10-CM | POA: Diagnosis not present

## 2021-10-02 DIAGNOSIS — D225 Melanocytic nevi of trunk: Secondary | ICD-10-CM | POA: Diagnosis not present

## 2021-10-02 NOTE — Telephone Encounter (Signed)
Patient sent message via mychart about scheduling appointment for Bladder leaking, has become more frequent and would like to talk about options . We are unable to scheduled with [facility] at this time. Called and left voicemail for patient to call back to be scheduled with New provider

## 2021-10-25 DIAGNOSIS — H538 Other visual disturbances: Secondary | ICD-10-CM | POA: Diagnosis not present

## 2021-10-25 DIAGNOSIS — R42 Dizziness and giddiness: Secondary | ICD-10-CM | POA: Diagnosis not present

## 2021-10-25 DIAGNOSIS — R110 Nausea: Secondary | ICD-10-CM | POA: Diagnosis not present

## 2021-10-25 DIAGNOSIS — R030 Elevated blood-pressure reading, without diagnosis of hypertension: Secondary | ICD-10-CM | POA: Diagnosis not present

## 2021-10-26 ENCOUNTER — Other Ambulatory Visit: Payer: Self-pay

## 2021-10-26 ENCOUNTER — Emergency Department
Admission: EM | Admit: 2021-10-26 | Discharge: 2021-10-26 | Disposition: A | Discharge status: Home / Self Care | Payer: BC Managed Care – PPO | Attending: Emergency Medicine | Admitting: Emergency Medicine

## 2021-10-26 DIAGNOSIS — R112 Nausea with vomiting, unspecified: Secondary | ICD-10-CM | POA: Diagnosis not present

## 2021-10-26 DIAGNOSIS — R531 Weakness: Secondary | ICD-10-CM | POA: Diagnosis not present

## 2021-10-26 DIAGNOSIS — R42 Dizziness and giddiness: Secondary | ICD-10-CM | POA: Diagnosis not present

## 2021-10-26 DIAGNOSIS — R1110 Vomiting, unspecified: Secondary | ICD-10-CM

## 2021-10-26 DIAGNOSIS — R197 Diarrhea, unspecified: Secondary | ICD-10-CM | POA: Diagnosis not present

## 2021-10-26 LAB — CBC
HCT: 42.9 % (ref 36.0–46.0)
Hemoglobin: 14.4 g/dL (ref 12.0–15.0)
MCH: 30.7 pg (ref 26.0–34.0)
MCHC: 33.6 g/dL (ref 30.0–36.0)
MCV: 91.5 fL (ref 80.0–100.0)
Platelets: 204 10*3/uL (ref 150–400)
RBC: 4.69 MIL/uL (ref 3.87–5.11)
RDW: 12 % (ref 11.5–15.5)
WBC: 7.1 10*3/uL (ref 4.0–10.5)
nRBC: 0 % (ref 0.0–0.2)

## 2021-10-26 LAB — TROPONIN I (HIGH SENSITIVITY): Troponin I (High Sensitivity): 2 ng/L (ref <18)

## 2021-10-26 LAB — URINALYSIS, ROUTINE W REFLEX MICROSCOPIC
Bilirubin Urine: NEGATIVE
Glucose, UA: NEGATIVE mg/dL
Hgb urine dipstick: NEGATIVE
Ketones, ur: NEGATIVE mg/dL
Leukocytes,Ua: NEGATIVE
Nitrite: NEGATIVE
Protein, ur: NEGATIVE mg/dL
Specific Gravity, Urine: 1.005 (ref 1.005–1.030)
pH: 7 (ref 5.0–8.0)

## 2021-10-26 LAB — TSH: TSH: 1.437 u[IU]/mL (ref 0.350–4.500)

## 2021-10-26 LAB — BASIC METABOLIC PANEL
Anion gap: 8 (ref 5–15)
BUN: 16 mg/dL (ref 6–20)
CO2: 29 mmol/L (ref 22–32)
Calcium: 9.7 mg/dL (ref 8.9–10.3)
Chloride: 102 mmol/L (ref 98–111)
Creatinine, Ser: 0.85 mg/dL (ref 0.44–1.00)
GFR, Estimated: >60 mL/min (ref >60)
Glucose, Bld: 93 mg/dL (ref 70–99)
Potassium: 4.4 mmol/L (ref 3.5–5.1)
Sodium: 139 mmol/L (ref 135–145)

## 2021-10-26 LAB — POC URINE PREG, ED: Preg Test, Ur: NEGATIVE

## 2021-10-26 LAB — CBG MONITORING, ED: Glucose-Capillary: 100 mg/dL — ABNORMAL HIGH (ref 70–99)

## 2021-10-26 MED ORDER — ONDANSETRON 4 MG PO TBDP: 4.0000 mg | ORAL_TABLET | Freq: Three times a day (TID) | ORAL | 0 refills | Status: DC | PRN

## 2021-10-26 NOTE — ED Notes (Signed)
AVS with prescriptions provided to and discussed with patient. Pt verbalizes understanding of discharge instructions and denies any questions or concerns at this time. Pt ambulated out of department independently with steady gait. ? ?

## 2021-10-26 NOTE — ED Triage Notes (Signed)
Pt had shingles shot last Friday and had N/V/D, HA, flushing intermittently since then. Pt walking to triage, NAD at this time.   ?

## 2021-10-26 NOTE — ED Provider Notes (Signed)
? ?[facility] ?Provider Note ? ? ? Event Date/Time  ? First MD Initiated Contact with Patient 10/26/21 1211   ?  (approximate) ? ? ?History  ? ?Chief Complaint ?Dizziness ? ? ?HPI ? ?Kelly Yang is a 56 y.o. female with past medical history of hyperlipidemia, anxiety, and chronic back pain who presents to the ED complaining of dizziness.  Patient reports that she received the second dose of her shingles vaccine about 1 week ago, subsequently developed dizziness, nausea, vomiting, and diarrhea.  She states that she will feel flushed and lightheaded at times along with some generalized weakness and shakiness.  The vomiting and the diarrhea have improved, but she continues to feel slightly shaky and unsteady on her feet at times.  She denies any vision changes, speech changes, focal numbness or weakness.  She denies any fevers, cough, chest pain, shortness of breath, abdominal pain, or dysuria.  She initially presented to [facility] clinic today but was referred to the ED for further evaluation. ?  ? ? ?Physical Exam  ? ?Triage Vital Signs: ?ED Triage Vitals  ?Enc Vitals Group  ?   BP 10/26/21 1148 (!) 161/79  ?   Pulse Rate 10/26/21 1148 85  ?   Resp 10/26/21 1148 20  ?   Temp 10/26/21 1148 98.4 ?F (36.9 ?C)  ?   Temp Source 10/26/21 1148 Oral  ?   SpO2 10/26/21 1148 98 %  ?   Weight 10/26/21 1149 158 lb (71.7 kg)  ?   Height 10/26/21 1149 5\' 3"  (1.6 m)  ?   Head Circumference --   ?   Peak Flow --   ?   Pain Score 10/26/21 1148 0  ?   Pain Loc --   ?   Pain Edu? --   ?   Excl. in GC? --   ? ? ?Most recent vital signs: ?Vitals:  ? 10/26/21 1148 10/26/21 1325  ?BP: (!) 161/79 137/79  ?Pulse: 85 78  ?Resp: 20 18  ?Temp: 98.4 ?F (36.9 ?C)   ?SpO2: 98% 98%  ? ? ?Constitutional: Alert and oriented. ?Eyes: Conjunctivae are normal. ?Head: Atraumatic. ?Nose: No congestion/rhinnorhea. ?Mouth/Throat: Mucous membranes are moist.  ?Cardiovascular: Normal rate, regular rhythm. Grossly normal heart sounds.   2+ radial and DP pulses bilaterally. ?Respiratory: Normal respiratory effort.  No retractions. Lungs CTAB. ?Gastrointestinal: Soft and nontender. No distention. ?Musculoskeletal: No lower extremity tenderness nor edema.  ?Neurologic:  Normal speech and language. No gross focal neurologic deficits are appreciated. ? ? ? ?ED Results / Procedures / Treatments  ? ?Labs ?(all labs ordered are listed, but only abnormal results are displayed) ?Labs Reviewed  ?URINALYSIS, ROUTINE W REFLEX MICROSCOPIC - Abnormal; Notable for the following components:  ?    Result Value  ? Color, Urine STRAW (*)   ? APPearance CLEAR (*)   ? All other components within normal limits  ?CBG MONITORING, ED - Abnormal; Notable for the following components:  ? Glucose-Capillary 100 (*)   ? All other components within normal limits  ?BASIC METABOLIC PANEL  ?CBC  ?TSH  ?POC URINE PREG, ED  ?TROPONIN I (HIGH SENSITIVITY)  ? ? ? ?EKG ? ?ED ECG REPORT ?10/28/21, the attending physician, personally viewed and interpreted this ECG. ? ? Date: 10/26/2021 ? EKG Time: 11:56 ? Rate: 74 ? Rhythm: normal sinus rhythm ? Axis: Normal ? Intervals:none ? ST&T Change: None ? ?PROCEDURES: ? ?Critical Care performed: No ? ?Procedures ? ? ?MEDICATIONS  ORDERED IN ED: ?Medications - No data to display ? ? ?IMPRESSION / MDM / ASSESSMENT AND PLAN / ED COURSE  ?I reviewed the triage vital signs and the nursing notes. ?             ?               ? ?56 y.o. female with past medical history of hyperlipidemia, anxiety, and chronic back pain who presents to the ED with 1 week of nausea, vomiting, diarrhea, dizziness, lightheadedness, and flushing. ? ?Differential diagnosis includes, but is not limited to, gastroenteritis, dehydration, electrolyte abnormality, UTI, stroke, arrhythmia, ACS. ? ?Patient nontoxic-appearing and in no acute distress, vital signs are unremarkable, EKG shows no evidence of arrhythmia or ischemia.  She has no focal neurologic deficits on exam  and dizziness is described as a lightheadedness rather than vertigo, low suspicion for stroke as the cause of her symptoms.  CBC shows no anemia or leukocytosis, BMP without electrolyte abnormality or AKI.  Urinalysis shows no signs of infection and pregnancy testing is negative.  Patient states that nausea and diarrhea have improved and she is tolerating liquids and solids here in the ED without difficulty.  We will add on troponin and TSH, but if these are unremarkable I suspect symptoms related to mild dehydration and gastroenteritis. ? ?Troponin and TSH within normal limits, patient is appropriate for discharge home with PCP follow-up and was provided with referral to establish care with PCP.  She was counseled to return to the ED for new worsening symptoms, patient agrees with plan. ? ?  ? ? ?FINAL CLINICAL IMPRESSION(S) / ED DIAGNOSES  ? ?Final diagnoses:  ?Dizziness  ?Vomiting and diarrhea  ? ? ? ?Rx / DC Orders  ? ?ED Discharge Orders   ? ? None  ? ?  ? ? ? ?Note:  This document was prepared using Dragon voice recognition software and may include unintentional dictation errors. ?  ?Chesley Noon, MD ?10/26/21 1422 ? ?

## 2021-10-26 NOTE — ED Triage Notes (Signed)
First RN note: ? ?Pt brought over by [facility] clinic for dizziness, N/V/D, shaking, and flushing.  PT had shingles shot last Friday and the symptoms started after that.  ?

## 2021-11-08 ENCOUNTER — Ambulatory Visit (INDEPENDENT_AMBULATORY_CARE_PROVIDER_SITE_OTHER): Payer: BC Managed Care – PPO | Admitting: Obstetrics and Gynecology

## 2021-11-08 ENCOUNTER — Encounter: Payer: Self-pay | Admitting: Obstetrics and Gynecology

## 2021-11-08 VITALS — BP 120/80 | Ht 63.5 in | Wt 160.0 lb

## 2021-11-08 DIAGNOSIS — R32 Unspecified urinary incontinence: Secondary | ICD-10-CM

## 2021-11-08 NOTE — Progress Notes (Signed)
56 yo P3 presenting today for the evaluation of urge incontinence. Patient reports some leakage of urine with laughing, coughing and sneezing. Lately, she has felt the need to wear a pad as she is experiencing more urgency. She admits to taking an herbal tea for weight loss which acts as a diuretic. She stopped it briefly due to a cold and noticed that her urgency resolved. She is without any other complaints ? ?Past Medical History:  ?Diagnosis Date  ? Allergic rhinitis   ? rhinitis  ? Genital herpes   ? Hiatal hernia   ? Marijuana smoker   ? ?Past Surgical History:  ?Procedure Laterality Date  ? ABDOMINAL HYSTERECTOMY    ? partical  ? ANKLE SURGERY    ? bilateral spurs  ? BACK SURGERY  2016  ? GANGLION CYST EXCISION    ? right wrist  ? TONSILLECTOMY    ? TUBAL LIGATION    ? UPPER GI ENDOSCOPY  04/13/2011, 04-19-16  ? Dr Verl Blalock, Dr Vira Agar  ? ?Family History  ?Problem Relation Age of Onset  ? Hyperlipidemia Mother   ?     15?  ? Breast cancer Mother   ? Other Mother   ?     brain blockage-stent placement  ? Bladder Cancer Mother   ?     37?  ? Lung cancer Father   ? COPD Father   ? Coronary artery disease Father 51  ?     had CABG   ? Fibromyalgia Sister   ? Colon cancer Neg Hx   ? ?Social History  ? ?Tobacco Use  ? Smoking status: Never  ? Smokeless tobacco: Never  ? Tobacco comments:  ?  Uses marijuana,daily use for over 15 years , still uses regularly but not daily  ?Vaping Use  ? Vaping Use: Never used  ?Substance Use Topics  ? Alcohol use: Yes  ?  Comment: occasional  ? Drug use: Yes  ?  Types: Marijuana  ?  Comment: marijuana  ? ?ROS ?See pertinent in HPI. All other systems reviewed and non contributory ?Blood pressure 120/80, height 5' 3.5" (1.613 m), weight 160 lb (72.6 kg). ?GENERAL: Well-developed, well-nourished female in no acute distress.  ?NEURO: alert and oriented x 3 ? ?A/P 56 yo P3 with urge incontinence likely secondary to herbal tea ?- Reassurance provided ?- Advised to practise  scheduled bathroom breaks while drinking tea to help with urgency ?- Offered a referral to urogynecology for further evaluation and management- patient desires to try scheduled bathroom breaks first ?- Patient is current on pap smear, mammogram and colonoscopy ? ?

## 2022-02-19 DIAGNOSIS — M9904 Segmental and somatic dysfunction of sacral region: Secondary | ICD-10-CM | POA: Diagnosis not present

## 2022-02-19 DIAGNOSIS — M9902 Segmental and somatic dysfunction of thoracic region: Secondary | ICD-10-CM | POA: Diagnosis not present

## 2022-02-19 DIAGNOSIS — M5387 Other specified dorsopathies, lumbosacral region: Secondary | ICD-10-CM | POA: Diagnosis not present

## 2022-02-19 DIAGNOSIS — M9903 Segmental and somatic dysfunction of lumbar region: Secondary | ICD-10-CM | POA: Diagnosis not present

## 2022-02-21 DIAGNOSIS — M9902 Segmental and somatic dysfunction of thoracic region: Secondary | ICD-10-CM | POA: Diagnosis not present

## 2022-02-21 DIAGNOSIS — M9903 Segmental and somatic dysfunction of lumbar region: Secondary | ICD-10-CM | POA: Diagnosis not present

## 2022-02-21 DIAGNOSIS — M5387 Other specified dorsopathies, lumbosacral region: Secondary | ICD-10-CM | POA: Diagnosis not present

## 2022-02-21 DIAGNOSIS — M9904 Segmental and somatic dysfunction of sacral region: Secondary | ICD-10-CM | POA: Diagnosis not present

## 2022-02-27 DIAGNOSIS — M5387 Other specified dorsopathies, lumbosacral region: Secondary | ICD-10-CM | POA: Diagnosis not present

## 2022-02-27 DIAGNOSIS — M9902 Segmental and somatic dysfunction of thoracic region: Secondary | ICD-10-CM | POA: Diagnosis not present

## 2022-02-27 DIAGNOSIS — M9903 Segmental and somatic dysfunction of lumbar region: Secondary | ICD-10-CM | POA: Diagnosis not present

## 2022-02-27 DIAGNOSIS — M9904 Segmental and somatic dysfunction of sacral region: Secondary | ICD-10-CM | POA: Diagnosis not present

## 2022-03-01 DIAGNOSIS — M9903 Segmental and somatic dysfunction of lumbar region: Secondary | ICD-10-CM | POA: Diagnosis not present

## 2022-03-01 DIAGNOSIS — M9904 Segmental and somatic dysfunction of sacral region: Secondary | ICD-10-CM | POA: Diagnosis not present

## 2022-03-01 DIAGNOSIS — M9902 Segmental and somatic dysfunction of thoracic region: Secondary | ICD-10-CM | POA: Diagnosis not present

## 2022-03-01 DIAGNOSIS — M5387 Other specified dorsopathies, lumbosacral region: Secondary | ICD-10-CM | POA: Diagnosis not present

## 2022-03-13 ENCOUNTER — Other Ambulatory Visit: Payer: Self-pay

## 2022-03-13 MED ORDER — ROSUVASTATIN CALCIUM 10 MG PO TABS: 10.0000 mg | ORAL_TABLET | Freq: Every day | ORAL | 0 refills | Status: DC

## 2022-04-06 DIAGNOSIS — L301 Dyshidrosis [pompholyx]: Secondary | ICD-10-CM | POA: Diagnosis not present

## 2022-04-06 DIAGNOSIS — L821 Other seborrheic keratosis: Secondary | ICD-10-CM | POA: Diagnosis not present

## 2022-06-13 ENCOUNTER — Emergency Department
Admission: EM | Admit: 2022-06-13 | Discharge: 2022-06-14 | Disposition: A | Discharge status: Home / Self Care | Payer: BC Managed Care – PPO | Attending: Emergency Medicine | Admitting: Emergency Medicine

## 2022-06-13 ENCOUNTER — Encounter: Payer: Self-pay | Admitting: *Deleted

## 2022-06-13 ENCOUNTER — Other Ambulatory Visit: Payer: Self-pay

## 2022-06-13 DIAGNOSIS — Z1152 Encounter for screening for COVID-19: Secondary | ICD-10-CM | POA: Insufficient documentation

## 2022-06-13 DIAGNOSIS — J101 Influenza due to other identified influenza virus with other respiratory manifestations: Secondary | ICD-10-CM | POA: Diagnosis not present

## 2022-06-13 DIAGNOSIS — R0981 Nasal congestion: Secondary | ICD-10-CM | POA: Diagnosis not present

## 2022-06-13 LAB — RESP PANEL BY RT-PCR (RSV, FLU A&B, COVID)  RVPGX2
Influenza A by PCR: POSITIVE — AB
Influenza B by PCR: NEGATIVE
Resp Syncytial Virus by PCR: NEGATIVE
SARS Coronavirus 2 by RT PCR: NEGATIVE

## 2022-06-13 MED ORDER — ONDANSETRON 4 MG PO TBDP: 4.0000 mg | ORAL_TABLET | Freq: Three times a day (TID) | ORAL | 0 refills | Status: AC | PRN

## 2022-06-13 MED ORDER — BENZONATATE 100 MG PO CAPS: 100.0000 mg | ORAL_CAPSULE | Freq: Three times a day (TID) | ORAL | 0 refills | Status: AC | PRN

## 2022-06-13 MED ORDER — ONDANSETRON HCL 4 MG/2ML IJ SOLN
4.0000 mg | Freq: Once | INTRAMUSCULAR | Status: AC
  Administered 2022-06-13: 4 mg via INTRAVENOUS
  Filled 2022-06-13: qty 2

## 2022-06-13 MED ORDER — SODIUM CHLORIDE 0.9 % IV BOLUS
1000.0000 mL | Freq: Once | INTRAVENOUS | Status: AC
  Administered 2022-06-13: 1000 mL via INTRAVENOUS

## 2022-06-13 NOTE — ED Triage Notes (Signed)
Pt has cough, congestion, fever, vomiting, headache for 2 days.  Pt taking otc meds without relief  pt alert

## 2022-06-13 NOTE — Discharge Instructions (Addendum)
Take 2 Tessalon Perles at night before bed for cough. You can take Zofran every 8 hours for nausea and vomiting.

## 2022-06-13 NOTE — ED Provider Notes (Signed)
[facility] Provider Note  Patient Contact: 10:10 PM (approximate)   History   Cough and Nasal Congestion   HPI  Kelly Yang is a 56 y.o. female presents to the emergency department with rhinorrhea, nasal congestion and nonproductive cough for the past 2 to 3 days.  Patient has also had vomiting and states that she has not been able to keep down fluids.  No chest pain, chest tightness or abdominal pain.     Physical Exam   Triage Vital Signs: ED Triage Vitals  Enc Vitals Group     BP 06/13/22 1850 139/77     Pulse Rate 06/13/22 1850 73     Resp 06/13/22 1850 20     Temp 06/13/22 1850 98.3 F (36.8 C)     Temp Source 06/13/22 1850 Oral     SpO2 06/13/22 1850 99 %     Weight 06/13/22 1848 158 lb (71.7 kg)     Height 06/13/22 1848 5\' 3"  (1.6 m)     Head Circumference --      Peak Flow --      Pain Score 06/13/22 1847 8     Pain Loc --      Pain Edu? --      Excl. in GC? --     Most recent vital signs: Vitals:   06/13/22 1850  BP: 139/77  Pulse: 73  Resp: 20  Temp: 98.3 F (36.8 C)  SpO2: 99%     Constitutional: Alert and oriented. Patient is lying supine. Eyes: Conjunctivae are normal. PERRL. EOMI. Head: Atraumatic. ENT:      Ears: Tympanic membranes are mildly injected with mild effusion bilaterally.       Nose: No congestion/rhinnorhea.      Mouth/Throat: Mucous membranes are moist. Posterior pharynx is mildly erythematous.  Hematological/Lymphatic/Immunilogical: No cervical lymphadenopathy.  Cardiovascular: Normal rate, regular rhythm. Normal S1 and S2.  Good peripheral circulation. Respiratory: Normal respiratory effort without tachypnea or retractions. Lungs CTAB. Good air entry to the bases with no decreased or absent breath sounds. Gastrointestinal: Bowel sounds 4 quadrants. Soft and nontender to palpation. No guarding or rigidity. No palpable masses. No distention. No CVA tenderness. Musculoskeletal: Full range of motion  to all extremities. No gross deformities appreciated. Neurologic:  Normal speech and language. No gross focal neurologic deficits are appreciated.  Skin:  Skin is warm, dry and intact. No rash noted. Psychiatric: Mood and affect are normal. Speech and behavior are normal. Patient exhibits appropriate insight and judgement.   ED Results / Procedures / Treatments   Labs (all labs ordered are listed, but only abnormal results are displayed) Labs Reviewed  RESP PANEL BY RT-PCR (RSV, FLU A&B, COVID)  RVPGX2 - Abnormal; Notable for the following components:      Result Value   Influenza A by PCR POSITIVE (*)    All other components within normal limits        PROCEDURES:  Critical Care performed: No  Procedures   MEDICATIONS ORDERED IN ED: Medications  sodium chloride 0.9 % bolus 1,000 mL (1,000 mLs Intravenous New Bag/Given 06/13/22 2231)  ondansetron (ZOFRAN) injection 4 mg (4 mg Intravenous Given 06/13/22 2232)     IMPRESSION / MDM / ASSESSMENT AND PLAN / ED COURSE  I reviewed the triage vital signs and the nursing notes.  Assessment and plan Influenza A 56 year old female presents to the emergency department with flulike symptoms for the past 2 to 3 days.  Vital signs are reassuring at triage.  On exam, patient was alert, active and nontoxic appearing.  Patient received normal saline bolus and IV soap apartment.  Recommended Tylenol and ibuprofen alternating for headache and bodyaches increase hydration at home.  She was discharged with Zofran and Tessalon Perles.  FINAL CLINICAL IMPRESSION(S) / ED DIAGNOSES   Final diagnoses:  Influenza A     Rx / DC Orders   ED Discharge Orders          Ordered    ondansetron (ZOFRAN-ODT) 4 MG disintegrating tablet  Every 8 hours PRN        06/13/22 2341    benzonatate (TESSALON PERLES) 100 MG capsule  3 times daily PRN        06/13/22 2341             Note:  This document was prepared  using Dragon voice recognition software and may include unintentional dictation errors.   Pia Mau Cantrall, PA-C 06/13/22 2345    Minna Antis, MD 06/14/22 1948

## 2022-07-03 ENCOUNTER — Telehealth: Payer: Self-pay

## 2022-07-03 ENCOUNTER — Other Ambulatory Visit: Payer: Self-pay | Admitting: Obstetrics and Gynecology

## 2022-07-03 DIAGNOSIS — E7800 Pure hypercholesterolemia, unspecified: Secondary | ICD-10-CM

## 2022-07-03 DIAGNOSIS — Z0000 Encounter for general adult medical examination without abnormal findings: Secondary | ICD-10-CM

## 2022-07-03 MED ORDER — ROSUVASTATIN CALCIUM 10 MG PO TABS: 10.0000 mg | ORAL_TABLET | Freq: Every day | ORAL | 0 refills | Status: DC

## 2022-07-03 NOTE — Telephone Encounter (Signed)
Rx RF eRxd. Pt to do labs before appt. Order placed, pls schedule fasting lab appt. Thx.

## 2022-07-03 NOTE — Telephone Encounter (Signed)
Pt is scheduling an annual with [facility] she needs a refill on her Crestor. Pt wants to know can you refill until her appointment and then does she need labs, I advised her she should have a PCP to manage this, she reports she does not, Please advise if you can refill.

## 2022-07-05 NOTE — Telephone Encounter (Signed)
Pt aware Rx RF sent on 07/03/22, she received notification from pharmacy. I advised labs are fasting and message has been sent to [facility] so they can call her to schedule lab appt.

## 2022-08-05 NOTE — Progress Notes (Unsigned)
PCP: Pcp, No   No chief complaint on file.   HPI:      [name] is a 57 y.o. [phone] whose LMP was No LMP recorded. Patient has had a hysterectomy., presents today for her annual examination.  Her menses are absent due to hyst; no PMB She {does:18564} have vasomotor sx.   Sex activity: {sex active: 315163}. She {does:18564} have vaginal dryness.  Last Pap: 06/21/21  Results were: no abnormalities /neg HPV DNA. Still has partial cx Hx of STDs: {STD hx:14358}  Last mammogram: 06/29/21  Results were: normal--routine follow-up in 12 months There is a FH of breast cancer in her mother???. There is no FH of ovarian cancer. The patient {does:18564} do self-breast exams.  Colonoscopy: 1/23  Neg Cologuard; Repeat due after 3 years.   Tobacco use: {tob:20664} Alcohol use: {Alcohol:11675} No drug use Exercise: {exercise:31265}  She {does:18564} get adequate calcium and Vitamin D in her diet.  Hx of borderline lipids, repeat labs due   Patient Active Problem List   Diagnosis Date Noted   Encounter for screening colonoscopy 09/27/2017   Menopausal and postmenopausal disorder 09/04/2017   Hypercholesteremia 09/04/2017   Chronic right-sided thoracic back pain 12/09/2016   Encounter for preventive health examination 05/27/2015   S/p partial hysterectomy with remaining cervical stump 05/25/2015   Constipation 05/25/2015   Dizziness 03/06/2013   Seasonal allergies 03/06/2013   Functional dyspepsia 05/03/2011   Abdominal pain 04/10/2011   Abdominal bloating 04/10/2011   Early satiety 04/10/2011    Past Surgical History:  Procedure Laterality Date   ABDOMINAL HYSTERECTOMY     partical   ANKLE SURGERY     bilateral spurs   BACK SURGERY  2016   GANGLION CYST EXCISION     right wrist   TONSILLECTOMY     TUBAL LIGATION     UPPER GI ENDOSCOPY  04/13/2011, 04-19-16   Dr Verl Blalock, Dr Vira Agar    Family History  Problem Relation Age of Onset   Hyperlipidemia  Mother        89?   Breast cancer Mother    Other Mother        brain blockage-stent placement   Bladder Cancer Mother        8?   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    Employer: [employer]  Tobacco Use   Smoking status: Never   Smokeless tobacco: Never   Tobacco comments:    Uses marijuana,daily use for over 15 years , still uses regularly but not daily  Vaping Use   Vaping Use: Never used  Substance and Sexual Activity   Alcohol use: Yes    Comment: occasional   Drug use: Yes    Types: Marijuana    Comment: marijuana   Sexual activity: Yes    Birth control/protection: None    Comment: remotely quit drug use(heavy marijuana for 15 years,quit when she was pregnant) and snorted cocaine < 10 times entire life in the past  Other Topics  Concern   Not on file  Social History Narrative   Lives with spouse and 2 childern   Has dog   Social Determinants of Health   Financial Resource Strain: Not on file  Food Insecurity: Not on file  Transportation Needs: Not on file  Physical Activity: Not on file  Stress: Not on file  Social Connections: Not on file  Intimate Partner Violence: Not on file     Current Outpatient Medications:    acetaminophen (TYLENOL) 500 MG tablet, Take 500 mg by mouth every 8 (eight) hours as needed. (Patient not taking: Reported on 11/08/2021), Disp: , Rfl:    estradiol (ESTRACE) 1 MG tablet, Take 1 tablet (1 mg total) by mouth daily., Disp: 90 tablet, Rfl: 3   rosuvastatin (CRESTOR) 10 MG tablet, Take 1 tablet (10 mg total) by mouth daily., Disp: 90 tablet, Rfl: 0     ROS:  Review of Systems BREAST:  No symptoms    Objective: There were no vitals taken for this visit.   OBGyn Exam  Results: No results found for this or any previous visit (from the past 24 hour(s)).  Assessment/Plan:  No diagnosis found.   No orders of the defined types were placed in this encounter.           GYN counsel {counseling: 16159}    F/U  No follow-ups on file.  Maleeha Halls B. Jeaninne Lodico, PA-C 08/05/2022 4:57 PM

## 2022-08-06 ENCOUNTER — Encounter: Payer: Self-pay | Admitting: Obstetrics and Gynecology

## 2022-08-06 ENCOUNTER — Ambulatory Visit (INDEPENDENT_AMBULATORY_CARE_PROVIDER_SITE_OTHER): Payer: BC Managed Care – PPO | Admitting: Obstetrics and Gynecology

## 2022-08-06 VITALS — BP 120/80 | Ht 63.5 in | Wt 164.0 lb

## 2022-08-06 DIAGNOSIS — Z0000 Encounter for general adult medical examination without abnormal findings: Secondary | ICD-10-CM | POA: Diagnosis not present

## 2022-08-06 DIAGNOSIS — Z1231 Encounter for screening mammogram for malignant neoplasm of breast: Secondary | ICD-10-CM

## 2022-08-06 DIAGNOSIS — E7800 Pure hypercholesterolemia, unspecified: Secondary | ICD-10-CM | POA: Diagnosis not present

## 2022-08-06 DIAGNOSIS — Z79890 Hormone replacement therapy (postmenopausal): Secondary | ICD-10-CM

## 2022-08-06 DIAGNOSIS — N951 Menopausal and female climacteric states: Secondary | ICD-10-CM

## 2022-08-06 DIAGNOSIS — A6004 Herpesviral vulvovaginitis: Secondary | ICD-10-CM | POA: Diagnosis not present

## 2022-08-06 DIAGNOSIS — Z01419 Encounter for gynecological examination (general) (routine) without abnormal findings: Secondary | ICD-10-CM

## 2022-08-06 MED ORDER — ESTRADIOL 1 MG PO TABS: 1.0000 mg | ORAL_TABLET | Freq: Every day | ORAL | 3 refills | Status: DC

## 2022-08-06 MED ORDER — VALACYCLOVIR HCL 500 MG PO TABS: 500.0000 mg | ORAL_TABLET | Freq: Two times a day (BID) | ORAL | 3 refills | Status: DC

## 2022-08-06 NOTE — Patient Instructions (Signed)
I value your feedback and you entrusting us with your care. If you get a [facility] patient survey, I would appreciate you taking the time to let us know about your experience today. Thank you!  [facility]: [phone]      

## 2022-08-07 LAB — COMPREHENSIVE METABOLIC PANEL
ALT: 15 IU/L (ref 0–32)
AST: 20 IU/L (ref 0–40)
Albumin/Globulin Ratio: 2.3 — ABNORMAL HIGH (ref 1.2–2.2)
Albumin: 4.5 g/dL (ref 3.8–4.9)
Alkaline Phosphatase: 62 IU/L (ref 44–121)
BUN/Creatinine Ratio: 11 (ref 9–23)
BUN: 11 mg/dL (ref 6–24)
Bilirubin Total: 0.3 mg/dL (ref 0.0–1.2)
CO2: 24 mmol/L (ref 20–29)
Calcium: 9.3 mg/dL (ref 8.7–10.2)
Chloride: 101 mmol/L (ref 96–106)
Creatinine, Ser: 0.96 mg/dL (ref 0.57–1.00)
Globulin, Total: 2 g/dL (ref 1.5–4.5)
Glucose: 85 mg/dL (ref 70–99)
Potassium: 4.3 mmol/L (ref 3.5–5.2)
Sodium: 141 mmol/L (ref 134–144)
Total Protein: 6.5 g/dL (ref 6.0–8.5)
eGFR: 70 mL/min/{1.73_m2} (ref >59)

## 2022-08-07 LAB — LIPID PANEL
Chol/HDL Ratio: 2.6 ratio (ref 0.0–4.4)
Cholesterol, Total: 187 mg/dL (ref 100–199)
HDL: 73 mg/dL (ref >39)
LDL Chol Calc (NIH): 89 mg/dL (ref 0–99)
Triglycerides: 146 mg/dL (ref 0–149)
VLDL Cholesterol Cal: 25 mg/dL (ref 5–40)

## 2022-08-07 MED ORDER — ROSUVASTATIN CALCIUM 10 MG PO TABS: 10.0000 mg | ORAL_TABLET | Freq: Every day | ORAL | 3 refills | Status: DC

## 2022-08-07 NOTE — Addendum Note (Signed)
Addended by: Ardeth Perfect B on: 12/01/5636 08:05 AM   Modules accepted: Orders

## 2022-10-03 ENCOUNTER — Ambulatory Visit
Admission: RE | Admit: 2022-10-03 | Discharge: 2022-10-03 | Disposition: A | Discharge status: Home / Self Care | Payer: BC Managed Care – PPO | Source: Ambulatory Visit | Attending: Obstetrics and Gynecology | Admitting: Obstetrics and Gynecology

## 2022-10-03 DIAGNOSIS — Z1231 Encounter for screening mammogram for malignant neoplasm of breast: Secondary | ICD-10-CM | POA: Insufficient documentation

## 2022-10-05 ENCOUNTER — Other Ambulatory Visit: Payer: Self-pay | Admitting: Obstetrics and Gynecology

## 2022-10-05 DIAGNOSIS — R928 Other abnormal and inconclusive findings on diagnostic imaging of breast: Secondary | ICD-10-CM

## 2022-10-05 DIAGNOSIS — N6489 Other specified disorders of breast: Secondary | ICD-10-CM

## 2022-10-09 DIAGNOSIS — D2272 Melanocytic nevi of left lower limb, including hip: Secondary | ICD-10-CM | POA: Diagnosis not present

## 2022-10-09 DIAGNOSIS — C44319 Basal cell carcinoma of skin of other parts of face: Secondary | ICD-10-CM | POA: Diagnosis not present

## 2022-10-09 DIAGNOSIS — L720 Epidermal cyst: Secondary | ICD-10-CM | POA: Diagnosis not present

## 2022-10-09 DIAGNOSIS — C44311 Basal cell carcinoma of skin of nose: Secondary | ICD-10-CM | POA: Diagnosis not present

## 2022-10-09 DIAGNOSIS — D2262 Melanocytic nevi of left upper limb, including shoulder: Secondary | ICD-10-CM | POA: Diagnosis not present

## 2022-10-09 DIAGNOSIS — D2261 Melanocytic nevi of right upper limb, including shoulder: Secondary | ICD-10-CM | POA: Diagnosis not present

## 2022-10-09 DIAGNOSIS — D485 Neoplasm of uncertain behavior of skin: Secondary | ICD-10-CM | POA: Diagnosis not present

## 2022-10-09 DIAGNOSIS — D225 Melanocytic nevi of trunk: Secondary | ICD-10-CM | POA: Diagnosis not present

## 2022-10-09 DIAGNOSIS — D2239 Melanocytic nevi of other parts of face: Secondary | ICD-10-CM | POA: Diagnosis not present

## 2022-10-15 ENCOUNTER — Ambulatory Visit
Admission: RE | Admit: 2022-10-15 | Discharge: 2022-10-15 | Disposition: A | Discharge status: Home / Self Care | Payer: BC Managed Care – PPO | Source: Ambulatory Visit | Attending: Obstetrics and Gynecology | Admitting: Obstetrics and Gynecology

## 2022-10-15 DIAGNOSIS — R928 Other abnormal and inconclusive findings on diagnostic imaging of breast: Secondary | ICD-10-CM | POA: Insufficient documentation

## 2022-10-15 DIAGNOSIS — N6489 Other specified disorders of breast: Secondary | ICD-10-CM | POA: Diagnosis not present

## 2022-10-16 ENCOUNTER — Other Ambulatory Visit: Payer: Self-pay | Admitting: Obstetrics and Gynecology

## 2022-10-16 ENCOUNTER — Encounter: Payer: Self-pay | Admitting: Obstetrics and Gynecology

## 2022-10-16 DIAGNOSIS — R928 Other abnormal and inconclusive findings on diagnostic imaging of breast: Secondary | ICD-10-CM

## 2022-10-16 DIAGNOSIS — N63 Unspecified lump in unspecified breast: Secondary | ICD-10-CM

## 2022-11-19 ENCOUNTER — Ambulatory Visit: Payer: BC Managed Care – PPO | Admitting: Podiatry

## 2022-11-19 ENCOUNTER — Encounter: Payer: Self-pay | Admitting: Podiatry

## 2022-11-19 ENCOUNTER — Ambulatory Visit (INDEPENDENT_AMBULATORY_CARE_PROVIDER_SITE_OTHER): Payer: BC Managed Care – PPO

## 2022-11-19 DIAGNOSIS — M722 Plantar fascial fibromatosis: Secondary | ICD-10-CM | POA: Diagnosis not present

## 2022-11-19 DIAGNOSIS — M7672 Peroneal tendinitis, left leg: Secondary | ICD-10-CM | POA: Diagnosis not present

## 2022-11-19 DIAGNOSIS — L600 Ingrowing nail: Secondary | ICD-10-CM | POA: Diagnosis not present

## 2022-11-19 MED ORDER — ONDANSETRON HCL 4 MG PO TABS: 4.0000 mg | ORAL_TABLET | Freq: Three times a day (TID) | ORAL | 0 refills | Status: DC | PRN

## 2022-11-19 MED ORDER — METHYLPREDNISOLONE 4 MG PO TBPK: ORAL_TABLET | ORAL | 0 refills | Status: DC

## 2022-11-19 MED ORDER — NEOMYCIN-POLYMYXIN-HC 1 % OT SOLN: OTIC | 1 refills | Status: DC

## 2022-11-19 MED ORDER — TRIAMCINOLONE ACETONIDE 40 MG/ML IJ SUSP
20.0000 mg | Freq: Once | INTRAMUSCULAR | Status: AC
  Administered 2022-11-19: 20 mg

## 2022-11-19 MED ORDER — MELOXICAM 15 MG PO TABS: 15.0000 mg | ORAL_TABLET | Freq: Every day | ORAL | 3 refills | Status: DC

## 2022-11-19 NOTE — Patient Instructions (Signed)

## 2022-11-20 NOTE — Progress Notes (Signed)
She presents today chief complaint of pain to the lateral aspect of her left foot which has been going on now for couple of months she denies any injury states that she is limping at times she tried ibuprofen she does have swelling in the area and severe tenderness.  She also has tenderness in the anterior ankle.  Her next complaint is pain to the right hallux on the lateral border states that is been bothering me now for the past few weeks appears to be ingrown.  Objective: Vital signs are stable she is alert and oriented x 3 pulses are palpable.  There is erythema and edema along the lateral border of the ingrown nail hallux right.  Is painful palpation no purulence no malodor no granulation tissue is visible.  Left foot demonstrate strong palpable pulses no edema she does have pain on palpation of the fifth metatarsal base and more specifically the peroneus longus as it courses through the arcuate portion of the cuboid.  Radiographs taken today demonstrate an osseously mature individual no significant osseous abnormalities in the area of question.  She does have some soft tissue swelling in that area.  This could represent tendinitis or even a bursitis.  Assessment: Ingrown nail fibular border hallux right.  Peroneal tendinitis bursitis fifth metatarsal left foot.  Plan: Started her on methylprednisolone to be followed by meloxicam.  Injected the area today with 10 mg of Kenalog and local anesthetic to the point of maximal tenderness making sure not to inject into the tendon itself.  I also performed a chemical matricectomy to the fibular border of the hallux right she tolerated procedure well without complications was given both oral written home-going instructions for the care and soaking of the toe as well as a prescription for Cortisporin Otic to be applied twice daily after soaking.  I will follow-up with her in 2 weeks

## 2022-12-03 ENCOUNTER — Ambulatory Visit: Payer: BC Managed Care – PPO | Admitting: Podiatry

## 2022-12-03 ENCOUNTER — Telehealth: Payer: Self-pay | Admitting: Podiatry

## 2022-12-03 NOTE — Telephone Encounter (Signed)
Pt was scheduled to see Dr Al Corpus today in [place] but got the message she needed to r/s as provider was out of the office. She called back and did not want to r/s stating the nail is doing good and she will call if needed

## 2022-12-28 DIAGNOSIS — M79644 Pain in right finger(s): Secondary | ICD-10-CM | POA: Diagnosis not present

## 2022-12-28 DIAGNOSIS — S63630A Sprain of interphalangeal joint of right index finger, initial encounter: Secondary | ICD-10-CM | POA: Diagnosis not present

## 2023-01-24 DIAGNOSIS — L814 Other melanin hyperpigmentation: Secondary | ICD-10-CM | POA: Diagnosis not present

## 2023-01-24 DIAGNOSIS — C44311 Basal cell carcinoma of skin of nose: Secondary | ICD-10-CM | POA: Diagnosis not present

## 2023-01-24 DIAGNOSIS — C44319 Basal cell carcinoma of skin of other parts of face: Secondary | ICD-10-CM | POA: Diagnosis not present

## 2023-01-24 DIAGNOSIS — L578 Other skin changes due to chronic exposure to nonionizing radiation: Secondary | ICD-10-CM | POA: Diagnosis not present

## 2023-02-11 ENCOUNTER — Ambulatory Visit: Payer: BC Managed Care – PPO | Admitting: Podiatry

## 2023-02-11 ENCOUNTER — Encounter: Payer: Self-pay | Admitting: Podiatry

## 2023-02-11 DIAGNOSIS — S86312A Strain of muscle(s) and tendon(s) of peroneal muscle group at lower leg level, left leg, initial encounter: Secondary | ICD-10-CM

## 2023-02-11 NOTE — Progress Notes (Signed)
Viveka presents today for follow-up of her peroneal tendinitis of her left ankle.  She states that it was so bad last week and I could not get in could not do anything.  She states that is affecting her ability perform her daily activities and is causing other pains throughout her ankle and leg.  She states that she has tried wearing her boot that she already has at home and anti-inflammatories.  She states that the injection that we put in there previously did not work very long.  Objective: Vital signs are stable she is alert and oriented x 3.  Pulses are palpable.  She has fluctuance on palpation of the peroneal tendon just beneath the lateral malleolus she also has exquisite pain on palpation of the arcuate portion of the cuboid bone where the peroneus longus tendon dives deep under the foot.  She has pain with plantarflexion and eversion.  Radiographs reviewed once again today not demonstrate any significant reason for this to be occurring other than soft tissue swelling along the lateral aspect of the foot.  Assessment probable peroneal longus tear at the arcuate portion of the cuboid.  Cannot rule out tendinitis.  Plan: Discussed etiology pathology conservative surgical therapies since all conservative therapy has failed to render her asymptomatic which would include steroids nonsteroidals injections immobilization ice and physical therapy I feel MRI is a reasonable choice for differential diagnosis as well as surgical planning.  Requesting MRI of the left lower extremity ankle and rear foot.

## 2023-02-12 ENCOUNTER — Telehealth: Payer: Self-pay | Admitting: Podiatry

## 2023-02-12 NOTE — Telephone Encounter (Signed)
Notified pt and she said thank you she just wanted to verify

## 2023-02-12 NOTE — Telephone Encounter (Signed)
Pt called and went to schedule the mri but it is of her ankle not her foot and her foot is what is hurting. She wants to make sure the correct test is being ordered.

## 2023-03-05 ENCOUNTER — Encounter: Payer: Self-pay | Admitting: Podiatry

## 2023-03-08 ENCOUNTER — Ambulatory Visit
Admission: RE | Admit: 2023-03-08 | Discharge: 2023-03-08 | Disposition: A | Discharge status: Home / Self Care | Payer: BC Managed Care – PPO | Source: Ambulatory Visit | Attending: Podiatry | Admitting: Podiatry

## 2023-03-08 DIAGNOSIS — S86312A Strain of muscle(s) and tendon(s) of peroneal muscle group at lower leg level, left leg, initial encounter: Secondary | ICD-10-CM

## 2023-03-08 DIAGNOSIS — M7662 Achilles tendinitis, left leg: Secondary | ICD-10-CM | POA: Diagnosis not present

## 2023-03-08 DIAGNOSIS — M7672 Peroneal tendinitis, left leg: Secondary | ICD-10-CM | POA: Diagnosis not present

## 2023-03-08 DIAGNOSIS — M25572 Pain in left ankle and joints of left foot: Secondary | ICD-10-CM | POA: Diagnosis not present

## 2023-03-27 NOTE — Progress Notes (Signed)
Called pt LM on VM for her to call the office to schedule appt

## 2023-03-29 NOTE — Progress Notes (Signed)
Pt scheduled to see Dr Al Corpus

## 2023-04-03 ENCOUNTER — Encounter: Payer: Self-pay | Admitting: Podiatry

## 2023-04-03 ENCOUNTER — Ambulatory Visit (INDEPENDENT_AMBULATORY_CARE_PROVIDER_SITE_OTHER): Payer: BC Managed Care – PPO | Admitting: Podiatry

## 2023-04-03 DIAGNOSIS — S86312D Strain of muscle(s) and tendon(s) of peroneal muscle group at lower leg level, left leg, subsequent encounter: Secondary | ICD-10-CM

## 2023-04-03 DIAGNOSIS — M722 Plantar fascial fibromatosis: Secondary | ICD-10-CM

## 2023-04-03 NOTE — Progress Notes (Signed)
Carter presents today for follow-up of her pain to her left foot.  She states that has not gotten any worse and she presents today with an antalgic gait.  Objective: Vital signs are stable alert oriented x 3.  Pulses are palpable.  There is no erythema edema cellulitis drainage or odor she does have severe pain on palpation of the arcuate portion of the cuboid where MRI does relate a tear of the peroneus brevis as well as a partial tear of the plantar fascia.  I am not able to palpate a lot of fluid on the peroneus brevis tendon but she definitely has some tenderness on his palpation.  Significant pain on palpation of the lateral band of the plantar fascia.  Assessment: Pain in limb secondary to tear of the plantar fascia per MRI left pain in limb secondary to peroneus brevis tendinitis tendinosis possible tear.  Plan: Discussed etiology pathology conservative surgical therapies at this point consented her for total endoscopic plantar fasciotomy.  Also consented her for a synovectomy and an open visualization of the peroneus brevis tendon.  She understands there is a possibility that we would have to open this all the way perform a complete repair of the peroneus brevis tendon and that would most likely involve a cast in a nonweightbearing fashion.  I will follow-up with her in the near future signed all 3 pages a consent form regarding these procedures and we did discuss the possible postop complications in detail.

## 2023-04-16 DIAGNOSIS — L601 Onycholysis: Secondary | ICD-10-CM | POA: Diagnosis not present

## 2023-04-16 DIAGNOSIS — D485 Neoplasm of uncertain behavior of skin: Secondary | ICD-10-CM | POA: Diagnosis not present

## 2023-04-16 DIAGNOSIS — L570 Actinic keratosis: Secondary | ICD-10-CM | POA: Diagnosis not present

## 2023-04-30 DIAGNOSIS — L570 Actinic keratosis: Secondary | ICD-10-CM | POA: Diagnosis not present

## 2023-05-03 DIAGNOSIS — E785 Hyperlipidemia, unspecified: Secondary | ICD-10-CM | POA: Diagnosis not present

## 2023-05-03 DIAGNOSIS — F4000 Agoraphobia, unspecified: Secondary | ICD-10-CM | POA: Diagnosis not present

## 2023-05-03 DIAGNOSIS — F5101 Primary insomnia: Secondary | ICD-10-CM | POA: Diagnosis not present

## 2023-05-03 DIAGNOSIS — F411 Generalized anxiety disorder: Secondary | ICD-10-CM | POA: Diagnosis not present

## 2023-06-21 ENCOUNTER — Telehealth: Payer: Self-pay

## 2023-06-21 DIAGNOSIS — Z79890 Hormone replacement therapy (postmenopausal): Secondary | ICD-10-CM

## 2023-06-21 DIAGNOSIS — N951 Menopausal and female climacteric states: Secondary | ICD-10-CM

## 2023-06-24 MED ORDER — ESTRADIOL 1 MG PO TABS: 1.0000 mg | ORAL_TABLET | Freq: Every day | ORAL | 0 refills | Status: AC

## 2023-06-24 NOTE — Telephone Encounter (Signed)
Patient has scheduled her annual for 08/08/23. Refill sent for Estrace.

## 2023-07-08 ENCOUNTER — Other Ambulatory Visit: Payer: Self-pay | Admitting: Obstetrics and Gynecology

## 2023-07-08 ENCOUNTER — Telehealth: Payer: Self-pay

## 2023-07-08 DIAGNOSIS — E7800 Pure hypercholesterolemia, unspecified: Secondary | ICD-10-CM

## 2023-07-08 MED ORDER — ROSUVASTATIN CALCIUM 10 MG PO TABS: 10.0000 mg | ORAL_TABLET | Freq: Every day | ORAL | 0 refills | Status: DC

## 2023-07-08 NOTE — Telephone Encounter (Signed)
Rx RF eRxd.  

## 2023-07-08 NOTE — Progress Notes (Signed)
 Rx RF rosuvastatin till 2/25 annual [facility].

## 2023-07-16 ENCOUNTER — Other Ambulatory Visit: Payer: BC Managed Care – PPO

## 2023-07-22 ENCOUNTER — Ambulatory Visit
Admission: RE | Admit: 2023-07-22 | Discharge: 2023-07-22 | Disposition: A | Discharge status: Home / Self Care | Payer: BC Managed Care – PPO | Source: Ambulatory Visit | Attending: Obstetrics and Gynecology | Admitting: Obstetrics and Gynecology

## 2023-07-22 DIAGNOSIS — R928 Other abnormal and inconclusive findings on diagnostic imaging of breast: Secondary | ICD-10-CM | POA: Diagnosis not present

## 2023-07-22 DIAGNOSIS — N63 Unspecified lump in unspecified breast: Secondary | ICD-10-CM | POA: Insufficient documentation

## 2023-07-23 ENCOUNTER — Encounter: Payer: Self-pay | Admitting: Obstetrics and Gynecology

## 2023-08-06 NOTE — Progress Notes (Signed)
 PCP: Pcp, No   Chief Complaint  Patient presents with   Gynecologic Exam    No concerns    HPI:      Kelly Yang is a 58 y.o. G3P3003 whose LMP was No LMP recorded. Patient has had a hysterectomy., presents today for her annual examination.  Her menses are absent due to lap supracx hyst 2010 for AUB/menorrhagia/pelvic pain; no PMB. Had vasomotor sx off ERT, but controlled with estradiol  1.0 mg daily. Wants to wean off.   Sex activity: single partner, contraception - status post hysterectomy. She does now have some vaginal dryness, hasn't tried lubricants. No pain/bleeding.   Last Pap: 06/21/21  Results were: no abnormalities /neg HPV DNA. Still has cx. No hx of abn paps with tx.  Hx of STDs: HSV, triggered by sex. Takes valtrex  prn. Needs Rx RF  Last mammogram: 10/03/22  Results were: cat 3 LT breast cyst, repeat u/s in 6 months was stable 1/25; dx mammo and LT breast u/s recommended at 4/25 mammo There is a FH of breast cancer in her mother, genetic testing not indicated. There is no FH of ovarian cancer. The patient does not do self-breast exams, can't feel breast cyst.  Colonoscopy: 1/23  Neg Cologuard; Repeat due after 3 years.   Tobacco use: The patient denies current or previous tobacco use. Alcohol use: none Daily MJ use.  Exercise: min active  She does get adequate calcium  and Vitamin D  in her diet. Hx of hyperlipidemia, on crestor  without side effects. Labs due. [facility] to [facility]. Has FH hyperlipidemia in mom and daughter.    Patient Active Problem List   Diagnosis Date Noted   Herpes simplex vulvovaginitis 08/06/2022   Encounter for screening colonoscopy 09/27/2017   Menopausal and postmenopausal disorder 09/04/2017   Hypercholesteremia 09/04/2017   Chronic right-sided thoracic back pain 12/09/2016   Encounter for preventive health examination 05/27/2015   S/p partial hysterectomy with remaining cervical stump 05/25/2015   Constipation 05/25/2015   Dizziness  03/06/2013   Seasonal allergies 03/06/2013   Functional dyspepsia 05/03/2011   Abdominal pain 04/10/2011   Abdominal bloating 04/10/2011   Early satiety 04/10/2011    Past Surgical History:  Procedure Laterality Date   ANKLE SURGERY     bilateral spurs   BACK SURGERY  2016   GANGLION CYST EXCISION     right wrist   LAPAROSCOPIC SUPRACERVICAL HYSTERECTOMY  2010   AUB, menorrhagia, pelvic pain   TONSILLECTOMY     TUBAL LIGATION     UPPER GI ENDOSCOPY  04/13/2011, 04-19-16   Dr Alm Gander, Dr Viktoria    Family History  Problem Relation Age of Onset   Hyperlipidemia Mother        24?   Breast cancer Mother 87   Other Mother        brain blockage-stent placement   Bladder Cancer Mother        38?   Lung cancer Father    COPD Father    Coronary artery disease Father 43       had CABG    Fibromyalgia Sister    Colon cancer Neg Hx     Social History   Socioeconomic History   Marital status: Married    Spouse name: Not on file   Number of children: 3   Years of education: Not on file   Highest education level: Not on file  Occupational History   Occupation: charity fundraiser II    Employer: [employer]  Tobacco Use   Smoking status: Never   Smokeless tobacco: Never   Tobacco comments:    Uses marijuana,daily use for over 15 years , still uses regularly but not daily  Vaping Use   Vaping status: Never Used  Substance and Sexual Activity   Alcohol use: Yes    Comment: occasional   Drug use: Yes    Types: Marijuana    Comment: marijuana   Sexual activity: Yes    Birth control/protection: None    Comment: remotely quit drug use(heavy marijuana for 15 years,quit when she was pregnant) and snorted cocaine < 10 times entire life in the past  Other Topics Concern   Not on file  Social History Narrative   Lives with spouse and 2 childern   Has dog   Social Drivers of Corporate Investment Banker Strain: Low Risk  (08/06/2023)   Received from [facility]   Overall Financial Resource Strain (CARDIA)    Difficulty of Paying Living Expenses: Not hard at all  Food Insecurity: No Food Insecurity (08/06/2023)   Received from [facility]   Hunger Vital Sign    Worried About Running Out of Food in the Last Year: Never true    Ran Out of Food in the Last Year: Never true  Transportation Needs: No Transportation Needs (08/06/2023)   Received from [facility] - Transportation    In the past 12 months, has lack of transportation kept you from medical appointments or from getting medications?: No    Lack of Transportation (Non-Medical): No  Physical Activity: Not on file  Stress: Not on file  Social Connections: Not on file  Intimate Partner Violence: Not on file     Current Outpatient Medications:    acetaminophen  (TYLENOL ) 500 MG tablet, Take 500 mg by mouth every 8 (eight) hours as needed., Disp: , Rfl:    Cholecalciferol (VITAMIN D -1000 MAX ST) 25 MCG (1000 UT) tablet, Take by mouth., Disp: , Rfl:    clobetasol cream (TEMOVATE) 0.05 %, Apply topically 2 (two) times daily as needed., Disp: , Rfl:    estradiol  (ESTRACE ) 1 MG tablet, Take 1 tablet (1 mg total) by mouth daily., Disp: 90 tablet, Rfl: 0   mirtazapine (REMERON) 30 MG tablet, Take 30 mg by mouth at bedtime., Disp: , Rfl:    rosuvastatin  (CRESTOR ) 10 MG tablet, Take 1 tablet (10 mg total) by mouth daily., Disp: 90 tablet, Rfl: 0   simethicone  (MYLICON) 80 MG chewable tablet, Chew by mouth., Disp: , Rfl:    solifenacin (VESICARE) 5 MG tablet, Take by mouth., Disp: , Rfl:    valACYclovir  (VALTREX ) 500 MG tablet, Take 1 tablet (500 mg total) by mouth 2 (two) times daily. For 3 days prn sx, Disp: 30 tablet, Rfl: 1     ROS:  Review of Systems  Constitutional:  Negative for fatigue, fever and unexpected weight change.  Respiratory:  Negative for cough, shortness of breath and wheezing.   Cardiovascular:  Negative for chest pain,  palpitations and leg swelling.  Gastrointestinal:  Negative for blood in stool, constipation, diarrhea, nausea and vomiting.  Endocrine: Negative for cold intolerance, heat intolerance and polyuria.  Genitourinary:  Positive for frequency. Negative for dyspareunia, dysuria, flank pain, genital sores, hematuria, menstrual problem, pelvic pain, urgency, vaginal bleeding, vaginal discharge and vaginal pain.  Musculoskeletal:  Positive for arthralgias. Negative for back pain, joint swelling and myalgias.  Skin:  Negative  for rash.  Neurological:  Negative for dizziness, syncope, light-headedness, numbness and headaches.  Hematological:  Negative for adenopathy.  Psychiatric/Behavioral:  Positive for agitation. Negative for confusion, sleep disturbance and suicidal ideas. The patient is not nervous/anxious.    BREAST: No symptoms    Objective: BP 114/68   Pulse 68   Ht 5' 3.5 (1.613 m)   Wt 169 lb (76.7 kg)   BMI 29.47 kg/m    Physical Exam Constitutional:      Appearance: She is well-developed.  Genitourinary:     Vulva normal.     Genitourinary Comments: UTERUS SURG REM     Right Labia: No rash, tenderness or lesions.    Left Labia: No tenderness, lesions or rash.    No vaginal discharge, erythema or tenderness.      Right Adnexa: not tender and no mass present.    Left Adnexa: not tender and no mass present.    No cervical friability or polyp.     Uterus is not tender.     Uterus is absent.  Breasts:    Right: No mass, nipple discharge, skin change or tenderness.     Left: No mass, nipple discharge, skin change or tenderness.  Neck:     Thyroid : No thyromegaly.  Cardiovascular:     Rate and Rhythm: Normal rate and regular rhythm.     Heart sounds: Normal heart sounds. No murmur heard. Pulmonary:     Effort: Pulmonary effort is normal.     Breath sounds: Normal breath sounds.  Abdominal:     Palpations: Abdomen is soft.     Tenderness: There is no abdominal  tenderness. There is no guarding or rebound.  Musculoskeletal:        General: Normal range of motion.     Cervical back: Normal range of motion.  Lymphadenopathy:     Cervical: No cervical adenopathy.  Neurological:     General: No focal deficit present.     Mental Status: She is alert and oriented to person, place, and time.     Cranial Nerves: No cranial nerve deficit.  Skin:    General: Skin is warm and dry.  Psychiatric:        Mood and Affect: Mood normal.        Behavior: Behavior normal.        Thought Content: Thought content normal.        Judgment: Judgment normal.  Vitals reviewed.     Assessment/Plan:  Encounter for annual routine gynecological examination  Encounter for screening mammogram for malignant neoplasm of breast - Plan: MM 3D DIAGNOSTIC MAMMOGRAM BILATERAL BREAST, US  LIMITED ULTRASOUND INCLUDING AXILLA LEFT BREAST ; pt to schedule mammo and u/s 4/25  Abnormal mammogram of left breast - Plan: MM 3D DIAGNOSTIC MAMMOGRAM BILATERAL BREAST, US  LIMITED ULTRASOUND INCLUDING AXILLA LEFT BREAST   Vasomotor symptoms due to menopause--pt plans to wean off ERT. Will f/u prn.   Herpes simplex vulvovaginitis - Plan: valACYclovir  (VALTREX ) 500 MG tablet; Rx RF.   Blood tests for routine general physical examination - Plan: Comprehensive metabolic panel, CBC with Differential/Platelet, Lipid panel, Hemoglobin A1c  Hypercholesteremia - Plan: Lipid panel; check labs and will send RF to [facility].   Screening for diabetes mellitus - Plan: Hemoglobin A1c   Meds ordered this encounter  Medications   valACYclovir  (VALTREX ) 500 MG tablet    Sig: Take 1 tablet (500 mg total) by mouth 2 (two) times daily. For 3 days prn sx  Dispense:  30 tablet    Refill:  1    Supervising Provider:   CHERRY, ANIKA [[id number]]            GYN counsel breast self exam, mammography screening, menopause, adequate intake of calcium  and vitamin D , diet and exercise    F/U  Return in about  1 year (around 08/07/2024).  Kelly Dragos B. Zsofia Prout, PA-C 08/08/2023 9:58 AM

## 2023-08-07 DIAGNOSIS — N3281 Overactive bladder: Secondary | ICD-10-CM | POA: Diagnosis not present

## 2023-08-07 DIAGNOSIS — E785 Hyperlipidemia, unspecified: Secondary | ICD-10-CM | POA: Diagnosis not present

## 2023-08-07 DIAGNOSIS — F411 Generalized anxiety disorder: Secondary | ICD-10-CM | POA: Diagnosis not present

## 2023-08-08 ENCOUNTER — Encounter: Payer: Self-pay | Admitting: Obstetrics and Gynecology

## 2023-08-08 ENCOUNTER — Ambulatory Visit (INDEPENDENT_AMBULATORY_CARE_PROVIDER_SITE_OTHER): Payer: BC Managed Care – PPO | Admitting: Obstetrics and Gynecology

## 2023-08-08 VITALS — BP 114/68 | HR 68 | Ht 63.5 in | Wt 169.0 lb

## 2023-08-08 DIAGNOSIS — R928 Other abnormal and inconclusive findings on diagnostic imaging of breast: Secondary | ICD-10-CM

## 2023-08-08 DIAGNOSIS — N951 Menopausal and female climacteric states: Secondary | ICD-10-CM

## 2023-08-08 DIAGNOSIS — Z1231 Encounter for screening mammogram for malignant neoplasm of breast: Secondary | ICD-10-CM

## 2023-08-08 DIAGNOSIS — E7800 Pure hypercholesterolemia, unspecified: Secondary | ICD-10-CM

## 2023-08-08 DIAGNOSIS — Z79890 Hormone replacement therapy (postmenopausal): Secondary | ICD-10-CM

## 2023-08-08 DIAGNOSIS — Z131 Encounter for screening for diabetes mellitus: Secondary | ICD-10-CM

## 2023-08-08 DIAGNOSIS — Z01419 Encounter for gynecological examination (general) (routine) without abnormal findings: Secondary | ICD-10-CM | POA: Diagnosis not present

## 2023-08-08 DIAGNOSIS — Z0000 Encounter for general adult medical examination without abnormal findings: Secondary | ICD-10-CM | POA: Diagnosis not present

## 2023-08-08 DIAGNOSIS — A6004 Herpesviral vulvovaginitis: Secondary | ICD-10-CM

## 2023-08-08 MED ORDER — VALACYCLOVIR HCL 500 MG PO TABS: 500.0000 mg | ORAL_TABLET | Freq: Two times a day (BID) | ORAL | 1 refills | Status: AC

## 2023-08-08 NOTE — Patient Instructions (Signed)
 I value your feedback and you entrusting [place] with your care. If you get a [facility] patient survey, I would appreciate you taking the time to let us know about your experience today. Thank you!  [facility] ([place])--[phone]

## 2023-08-09 LAB — COMPREHENSIVE METABOLIC PANEL
ALT: 14 [IU]/L (ref 0–32)
AST: 17 [IU]/L (ref 0–40)
Albumin: 4.5 g/dL (ref 3.8–4.9)
Alkaline Phosphatase: 71 [IU]/L (ref 44–121)
BUN/Creatinine Ratio: 20 (ref 9–23)
BUN: 18 mg/dL (ref 6–24)
Bilirubin Total: 0.4 mg/dL (ref 0.0–1.2)
CO2: 27 mmol/L (ref 20–29)
Calcium: 9.4 mg/dL (ref 8.7–10.2)
Chloride: 99 mmol/L (ref 96–106)
Creatinine, Ser: 0.92 mg/dL (ref 0.57–1.00)
Globulin, Total: 2.2 g/dL (ref 1.5–4.5)
Glucose: 83 mg/dL (ref 70–99)
Potassium: 4.5 mmol/L (ref 3.5–5.2)
Sodium: 140 mmol/L (ref 134–144)
Total Protein: 6.7 g/dL (ref 6.0–8.5)
eGFR: 73 mL/min/{1.73_m2} (ref >59)

## 2023-08-09 LAB — CBC WITH DIFFERENTIAL/PLATELET
Basophils Absolute: 0 10*3/uL (ref 0.0–0.2)
Basos: 1 %
EOS (ABSOLUTE): 0.1 10*3/uL (ref 0.0–0.4)
Eos: 2 %
Hematocrit: 40.4 % (ref 34.0–46.6)
Hemoglobin: 13.7 g/dL (ref 11.1–15.9)
Immature Grans (Abs): 0 10*3/uL (ref 0.0–0.1)
Immature Granulocytes: 0 %
Lymphocytes Absolute: 2.4 10*3/uL (ref 0.7–3.1)
Lymphs: 38 %
MCH: 31.7 pg (ref 26.6–33.0)
MCHC: 33.9 g/dL (ref 31.5–35.7)
MCV: 94 fL (ref 79–97)
Monocytes Absolute: 0.4 10*3/uL (ref 0.1–0.9)
Monocytes: 7 %
Neutrophils Absolute: 3.4 10*3/uL (ref 1.4–7.0)
Neutrophils: 52 %
Platelets: 209 10*3/uL (ref 150–450)
RBC: 4.32 x10E6/uL (ref 3.77–5.28)
RDW: 11.8 % (ref 11.7–15.4)
WBC: 6.4 10*3/uL (ref 3.4–10.8)

## 2023-08-09 LAB — HEMOGLOBIN A1C
Est. average glucose Bld gHb Est-mCnc: 111 mg/dL
Hgb A1c MFr Bld: 5.5 % (ref 4.8–5.6)

## 2023-08-09 LAB — LIPID PANEL
Chol/HDL Ratio: 2.9 {ratio} (ref 0.0–4.4)
Cholesterol, Total: 186 mg/dL (ref 100–199)
HDL: 64 mg/dL (ref >39)
LDL Chol Calc (NIH): 96 mg/dL (ref 0–99)
Triglycerides: 149 mg/dL (ref 0–149)
VLDL Cholesterol Cal: 26 mg/dL (ref 5–40)

## 2023-08-10 ENCOUNTER — Other Ambulatory Visit: Payer: Self-pay | Admitting: Obstetrics and Gynecology

## 2023-08-10 ENCOUNTER — Encounter: Payer: Self-pay | Admitting: Obstetrics and Gynecology

## 2023-08-10 DIAGNOSIS — E7800 Pure hypercholesterolemia, unspecified: Secondary | ICD-10-CM

## 2023-08-10 MED ORDER — ROSUVASTATIN CALCIUM 10 MG PO TABS: 10.0000 mg | ORAL_TABLET | Freq: Every day | ORAL | 3 refills | Status: AC

## 2023-08-10 NOTE — Progress Notes (Signed)
 Rx RF crestor 

## 2023-09-28 ENCOUNTER — Other Ambulatory Visit: Payer: Self-pay | Admitting: Obstetrics and Gynecology

## 2023-09-28 DIAGNOSIS — Z79890 Hormone replacement therapy (postmenopausal): Secondary | ICD-10-CM

## 2023-09-28 DIAGNOSIS — N951 Menopausal and female climacteric states: Secondary | ICD-10-CM

## 2023-10-10 ENCOUNTER — Other Ambulatory Visit: Payer: BC Managed Care – PPO

## 2023-10-30 ENCOUNTER — Encounter: Payer: Self-pay | Admitting: Obstetrics and Gynecology

## 2023-10-30 ENCOUNTER — Ambulatory Visit
Admission: RE | Admit: 2023-10-30 | Discharge: 2023-10-30 | Disposition: A | Discharge status: Home / Self Care | Source: Ambulatory Visit | Attending: Obstetrics and Gynecology | Admitting: Obstetrics and Gynecology

## 2023-10-30 ENCOUNTER — Ambulatory Visit
Admission: RE | Admit: 2023-10-30 | Discharge: 2023-10-30 | Disposition: A | Discharge status: Home / Self Care | Source: Ambulatory Visit | Attending: Obstetrics and Gynecology

## 2023-10-30 DIAGNOSIS — R928 Other abnormal and inconclusive findings on diagnostic imaging of breast: Secondary | ICD-10-CM | POA: Insufficient documentation

## 2023-10-30 DIAGNOSIS — Z1231 Encounter for screening mammogram for malignant neoplasm of breast: Secondary | ICD-10-CM | POA: Diagnosis not present

## 2023-10-30 DIAGNOSIS — N6322 Unspecified lump in the left breast, upper inner quadrant: Secondary | ICD-10-CM | POA: Diagnosis not present

## 2023-10-30 DIAGNOSIS — R92333 Mammographic heterogeneous density, bilateral breasts: Secondary | ICD-10-CM | POA: Diagnosis not present

## 2023-10-30 DIAGNOSIS — N6342 Unspecified lump in left breast, subareolar: Secondary | ICD-10-CM | POA: Diagnosis not present

## 2023-11-05 DIAGNOSIS — D225 Melanocytic nevi of trunk: Secondary | ICD-10-CM | POA: Diagnosis not present

## 2023-11-05 DIAGNOSIS — D2261 Melanocytic nevi of right upper limb, including shoulder: Secondary | ICD-10-CM | POA: Diagnosis not present

## 2023-11-05 DIAGNOSIS — D2262 Melanocytic nevi of left upper limb, including shoulder: Secondary | ICD-10-CM | POA: Diagnosis not present

## 2023-11-05 DIAGNOSIS — R208 Other disturbances of skin sensation: Secondary | ICD-10-CM | POA: Diagnosis not present

## 2023-11-05 DIAGNOSIS — L820 Inflamed seborrheic keratosis: Secondary | ICD-10-CM | POA: Diagnosis not present

## 2023-11-05 DIAGNOSIS — D485 Neoplasm of uncertain behavior of skin: Secondary | ICD-10-CM | POA: Diagnosis not present

## 2023-11-05 DIAGNOSIS — D2272 Melanocytic nevi of left lower limb, including hip: Secondary | ICD-10-CM | POA: Diagnosis not present

## 2024-02-27 DIAGNOSIS — M533 Sacrococcygeal disorders, not elsewhere classified: Secondary | ICD-10-CM | POA: Diagnosis not present

## 2024-02-27 DIAGNOSIS — M5459 Other low back pain: Secondary | ICD-10-CM | POA: Diagnosis not present

## 2024-03-03 DIAGNOSIS — M5459 Other low back pain: Secondary | ICD-10-CM | POA: Diagnosis not present

## 2024-03-03 DIAGNOSIS — M533 Sacrococcygeal disorders, not elsewhere classified: Secondary | ICD-10-CM | POA: Diagnosis not present

## 2024-03-05 DIAGNOSIS — M533 Sacrococcygeal disorders, not elsewhere classified: Secondary | ICD-10-CM | POA: Diagnosis not present

## 2024-03-05 DIAGNOSIS — M5459 Other low back pain: Secondary | ICD-10-CM | POA: Diagnosis not present

## 2024-04-28 DIAGNOSIS — R030 Elevated blood-pressure reading, without diagnosis of hypertension: Secondary | ICD-10-CM | POA: Diagnosis not present
